# Patient Record
Sex: Female | Born: 1960 | Race: White | Hispanic: No | Marital: Married | Smoking: Never smoker
Health system: Southern US, Community
[De-identification: ages and names within clinical notes are randomized; demographics above are authoritative.]

## PROBLEM LIST (undated history)

## (undated) DIAGNOSIS — E079 Disorder of thyroid, unspecified: Secondary | ICD-10-CM

## (undated) DIAGNOSIS — L0390 Cellulitis, unspecified: Secondary | ICD-10-CM

## (undated) DIAGNOSIS — R011 Cardiac murmur, unspecified: Secondary | ICD-10-CM

## (undated) DIAGNOSIS — I10 Essential (primary) hypertension: Secondary | ICD-10-CM

## (undated) DIAGNOSIS — E119 Type 2 diabetes mellitus without complications: Secondary | ICD-10-CM

## (undated) DIAGNOSIS — Z22322 Carrier or suspected carrier of Methicillin resistant Staphylococcus aureus: Secondary | ICD-10-CM

## (undated) HISTORY — PX: HERNIA REPAIR: SHX51

## (undated) HISTORY — PX: CHOLECYSTECTOMY: SHX55

## (undated) HISTORY — DX: Disorder of thyroid, unspecified: E07.9

## (undated) HISTORY — PX: APPENDECTOMY: SHX54

---

## 2012-12-27 ENCOUNTER — Emergency Department (HOSPITAL_COMMUNITY)
Admission: EM | Admit: 2012-12-27 | Discharge: 2012-12-27 | Disposition: A | Discharge status: Home / Self Care | Payer: BC Managed Care – PPO | Attending: Emergency Medicine | Admitting: Emergency Medicine

## 2012-12-27 ENCOUNTER — Encounter (HOSPITAL_COMMUNITY): Payer: Self-pay | Admitting: *Deleted

## 2012-12-27 DIAGNOSIS — L299 Pruritus, unspecified: Secondary | ICD-10-CM | POA: Insufficient documentation

## 2012-12-27 DIAGNOSIS — R21 Rash and other nonspecific skin eruption: Secondary | ICD-10-CM | POA: Insufficient documentation

## 2012-12-27 DIAGNOSIS — R011 Cardiac murmur, unspecified: Secondary | ICD-10-CM | POA: Insufficient documentation

## 2012-12-27 DIAGNOSIS — E119 Type 2 diabetes mellitus without complications: Secondary | ICD-10-CM | POA: Insufficient documentation

## 2012-12-27 DIAGNOSIS — I10 Essential (primary) hypertension: Secondary | ICD-10-CM | POA: Insufficient documentation

## 2012-12-27 DIAGNOSIS — Z91040 Latex allergy status: Secondary | ICD-10-CM | POA: Insufficient documentation

## 2012-12-27 DIAGNOSIS — Z8614 Personal history of Methicillin resistant Staphylococcus aureus infection: Secondary | ICD-10-CM | POA: Insufficient documentation

## 2012-12-27 DIAGNOSIS — L02419 Cutaneous abscess of limb, unspecified: Secondary | ICD-10-CM | POA: Insufficient documentation

## 2012-12-27 DIAGNOSIS — L0390 Cellulitis, unspecified: Secondary | ICD-10-CM

## 2012-12-27 DIAGNOSIS — Z880 Allergy status to penicillin: Secondary | ICD-10-CM | POA: Insufficient documentation

## 2012-12-27 HISTORY — DX: Cardiac murmur, unspecified: R01.1

## 2012-12-27 HISTORY — DX: Essential (primary) hypertension: I10

## 2012-12-27 HISTORY — DX: Cellulitis, unspecified: L03.90

## 2012-12-27 LAB — URINALYSIS, ROUTINE W REFLEX MICROSCOPIC
Bilirubin Urine: NEGATIVE
Glucose, UA: NEGATIVE mg/dL
Hgb urine dipstick: NEGATIVE
Ketones, ur: NEGATIVE mg/dL
Leukocytes, UA: NEGATIVE
Nitrite: NEGATIVE
Protein, ur: NEGATIVE mg/dL
Specific Gravity, Urine: 1.02 (ref 1.005–1.030)
Urobilinogen, UA: 0.2 mg/dL (ref 0.0–1.0)
pH: 6.5 (ref 5.0–8.0)

## 2012-12-27 LAB — BASIC METABOLIC PANEL
BUN: 17 mg/dL (ref 6–23)
CO2: 28 mEq/L (ref 19–32)
Calcium: 9.4 mg/dL (ref 8.4–10.5)
Chloride: 101 mEq/L (ref 96–112)
Creatinine, Ser: 0.93 mg/dL (ref 0.50–1.10)
GFR calc Af Amer: 79 mL/min — ABNORMAL LOW (ref >90)
GFR calc non Af Amer: 68 mL/min — ABNORMAL LOW (ref >90)
Glucose, Bld: 123 mg/dL — ABNORMAL HIGH (ref 70–99)
Potassium: 3.6 mEq/L (ref 3.5–5.1)
Sodium: 137 mEq/L (ref 135–145)

## 2012-12-27 LAB — CBC WITH DIFFERENTIAL/PLATELET
Basophils Absolute: 0 10*3/uL (ref 0.0–0.1)
Basophils Relative: 0 % (ref 0–1)
Eosinophils Absolute: 0.2 10*3/uL (ref 0.0–0.7)
Eosinophils Relative: 2 % (ref 0–5)
HCT: 35.9 % — ABNORMAL LOW (ref 36.0–46.0)
Hemoglobin: 12.1 g/dL (ref 12.0–15.0)
Lymphocytes Relative: 30 % (ref 12–46)
Lymphs Abs: 2.3 10*3/uL (ref 0.7–4.0)
MCH: 29.1 pg (ref 26.0–34.0)
MCHC: 33.7 g/dL (ref 30.0–36.0)
MCV: 86.3 fL (ref 78.0–100.0)
Monocytes Absolute: 0.6 10*3/uL (ref 0.1–1.0)
Monocytes Relative: 7 % (ref 3–12)
Neutro Abs: 4.6 10*3/uL (ref 1.7–7.7)
Neutrophils Relative %: 60 % (ref 43–77)
Platelets: 261 10*3/uL (ref 150–400)
RBC: 4.16 MIL/uL (ref 3.87–5.11)
RDW: 14 % (ref 11.5–15.5)
WBC: 7.6 10*3/uL (ref 4.0–10.5)

## 2012-12-27 MED ORDER — ACETAMINOPHEN 325 MG PO TABS
650.0000 mg | ORAL_TABLET | Freq: Once | ORAL | Status: AC
  Administered 2012-12-27: 650 mg via ORAL
  Filled 2012-12-27: qty 2

## 2012-12-27 MED ORDER — CLINDAMYCIN HCL 150 MG PO CAPS
300.0000 mg | ORAL_CAPSULE | Freq: Once | ORAL | Status: AC
  Administered 2012-12-27: 300 mg via ORAL
  Filled 2012-12-27: qty 2

## 2012-12-27 MED ORDER — CLINDAMYCIN HCL 300 MG PO CAPS: 300.0000 mg | ORAL_CAPSULE | Freq: Four times a day (QID) | ORAL | Status: DC

## 2012-12-27 MED ORDER — SODIUM CHLORIDE 0.9 % IV BOLUS (SEPSIS)
1000.0000 mL | Freq: Once | INTRAVENOUS | Status: AC
  Administered 2012-12-27: 1000 mL via INTRAVENOUS

## 2012-12-27 MED ORDER — SODIUM CHLORIDE 0.9 % IV SOLN
INTRAVENOUS | Status: DC
  Administered 2012-12-27: 18:00:00 via INTRAVENOUS

## 2012-12-27 NOTE — ED Notes (Signed)
Swelling, redness rt lower leg, Hx of cellulitis.

## 2012-12-27 NOTE — ED Notes (Signed)
Requesting tylenol for leg pain.  meds given.

## 2012-12-27 NOTE — ED Provider Notes (Signed)
History     This chart was scribed for Flint Melter, MD, MD by Smitty Pluck, ED Scribe. The patient was seen in room APAH6/APAH6 and the patient's care was started at 3:52 PM.   CSN: [id number]  Arrival date & time 12/27/12  1351    Chief Complaint  Patient presents with  . Recurrent Skin Infections     The history is provided by the patient and medical records. No language interpreter was used.   Vanessa Vega is a 52 y.o. female with hx of DM, who presents to the Emergency Department complaining of constant, moderate cellulitis of lower left leg onset 2 days ago. Pt reports that she had similar symptoms in the past and MRSA 2 years ago at same location. Pt reports redness, itching and swelling at site of cellulitis on lower right leg. She states she was given abx treatment last time and it worked. She does not take medication for DM because she was able to manage it with diet in the past but she has not had her CGB checked in a long time. Pt denies fever, chills, nausea, vomiting, diarrhea, weakness, cough, SOB and any other pain.   Past Medical History  Diagnosis Date  . Cellulitis   . Hypertension   . Heart murmur     Past Surgical History  Procedure Laterality Date  . Hernia repair    . Appendectomy    . Cholecystectomy      History reviewed. No pertinent family history.  History  Substance Use Topics  . Smoking status: Never Smoker   . Smokeless tobacco: Not on file  . Alcohol Use: No    OB History   Grav Para Term Preterm Abortions TAB SAB Ect Mult Living                  Review of Systems  Constitutional: Negative for fever and chills.  Skin: Positive for rash.  All other systems reviewed and are negative.    Allergies  Other; Latex; and Penicillins  Home Medications   Current Outpatient Rx  Name  Route  Sig  Dispense  Refill  . acetaminophen (TYLENOL) 500 MG tablet   Oral   Take 500 mg by mouth every 6 (six) hours as needed for pain.          . diphenhydrAMINE (BENADRYL) 25 MG tablet   Oral   Take 25-50 mg by mouth daily as needed for itching or allergies.         . Naphazoline-Polyethyl Glycol (EYE DROPS) 0.012-0.2 % SOLN   Ophthalmic   Apply 1 drop to eye daily as needed (for itching/allergy/symptoms).         . clindamycin (CLEOCIN) 300 MG capsule   Oral   Take 1 capsule (300 mg total) by mouth 4 (four) times daily. X 7 days   28 capsule   0     BP 159/91  Pulse 103  Temp(Src) 97.6 F (36.4 C) (Oral)  Resp 22  Ht 5\' 3"  (1.6 m)  Wt 350 lb (158.759 kg)  BMI 62.02 kg/m2  SpO2 99%  Physical Exam  Nursing note and vitals reviewed. Constitutional: She is oriented to person, place, and time. She appears well-developed and well-nourished.  HENT:  Head: Normocephalic and atraumatic.  Eyes: Conjunctivae and EOM are normal. Pupils are equal, round, and reactive to light.  Neck: Normal range of motion and phonation normal. Neck supple.  Cardiovascular: Normal rate, regular rhythm and intact distal pulses.  Pulmonary/Chest: Effort normal and breath sounds normal. She exhibits no tenderness.  Abdominal: Soft. She exhibits no distension. There is no tenderness. There is no guarding.  Musculoskeletal: Normal range of motion.  Right foot is mildly swollen but non tender  Neurological: She is alert and oriented to person, place, and time. She has normal strength. She exhibits normal muscle tone.  Skin: Skin is warm and dry.  Red with induration of lateral and posterior half of right lower leg    Psychiatric: She has a normal mood and affect. Her behavior is normal. Judgment and thought content normal.    ED Course  Procedures (including critical care time) DIAGNOSTIC STUDIES: Oxygen Saturation is 99% on room air, normal by my interpretation.   Filed Vitals:   12/27/12 1410 12/27/12 1642 12/27/12 1821  BP: 159/91 155/96 131/87  Pulse: 103 95 83  Temp: 97.6 F (36.4 C)    TempSrc: Oral    Resp: 22 18 20    Height: 5\' 3"  (1.6 m)    Weight: 350 lb (158.759 kg)    SpO2: 99% 94% 98%    COORDINATION OF CARE: 3:55 PM Discussed ED treatment with pt and pt agrees.  Medications  sodium chloride 0.9 % bolus 1,000 mL (0 mLs Intravenous Stopped 12/27/12 1823)  acetaminophen (TYLENOL) tablet 650 mg (650 mg Oral Given 12/27/12 1824)  clindamycin (CLEOCIN) capsule 300 mg (300 mg Oral Given 12/27/12 2022)    8:27 PM Discussed lab results with pt. Pt instructed to take abx and soak leg. Pt is ready for discharge.   Labs Reviewed  CBC WITH DIFFERENTIAL - Abnormal; Notable for the following:    HCT 35.9 (*)    All other components within normal limits  BASIC METABOLIC PANEL - Abnormal; Notable for the following:    Glucose, Bld 123 (*)    GFR calc non Af Amer 68 (*)    GFR calc Af Amer 79 (*)    All other components within normal limits  URINALYSIS, ROUTINE W REFLEX MICROSCOPIC - Abnormal; Notable for the following:    APPearance HAZY (*)    All other components within normal limits  URINE CULTURE      1. Cellulitis       MDM  Right leg, cellulitis, recurrent. No generalized infection, doubt sepsis, metabolic, instability or osteomyelitis. Her blood sugars only minimally elevated. Doubt metabolic instability, serious bacterial infection or impending vascular collapse; the patient is stable for discharge.    Nursing Notes Reviewed/ Care Coordinated, and agree without changes. Applicable Imaging Reviewed.  Interpretation of Laboratory Data incorporated into ED treatment    Plan: Home Medications- clindamycin; Home Treatments- heat treatment; Recommended follow up- PCP of choice as soon as possible   I personally performed the services described in this documentation, which was scribed in my presence. The recorded information has been reviewed and is accurate.        Flint Melter, MD 12/28/12 Jacinta Shoe

## 2012-12-27 NOTE — ED Notes (Signed)
Pt unable to void at this time. 

## 2012-12-27 NOTE — ED Notes (Signed)
C/o swelling/redness and pain to right lower Extremity x 2 days with hx of cellulitis in same leg.  Area warm to touch, red.  Pt denies fever.  No drainage.  nad noted.

## 2012-12-29 ENCOUNTER — Emergency Department (HOSPITAL_COMMUNITY): Payer: BC Managed Care – PPO

## 2012-12-29 ENCOUNTER — Inpatient Hospital Stay (HOSPITAL_COMMUNITY)
Admission: EM | Admit: 2012-12-29 | Discharge: 2013-01-01 | DRG: 278 | Disposition: A | Discharge status: Home / Self Care | Payer: BC Managed Care – PPO | Attending: Internal Medicine | Admitting: Internal Medicine

## 2012-12-29 ENCOUNTER — Encounter (HOSPITAL_COMMUNITY): Payer: Self-pay

## 2012-12-29 DIAGNOSIS — E669 Obesity, unspecified: Secondary | ICD-10-CM

## 2012-12-29 DIAGNOSIS — L03119 Cellulitis of unspecified part of limb: Principal | ICD-10-CM | POA: Diagnosis present

## 2012-12-29 DIAGNOSIS — Z880 Allergy status to penicillin: Secondary | ICD-10-CM

## 2012-12-29 DIAGNOSIS — E119 Type 2 diabetes mellitus without complications: Secondary | ICD-10-CM | POA: Diagnosis present

## 2012-12-29 DIAGNOSIS — E039 Hypothyroidism, unspecified: Secondary | ICD-10-CM | POA: Diagnosis present

## 2012-12-29 DIAGNOSIS — L0390 Cellulitis, unspecified: Secondary | ICD-10-CM

## 2012-12-29 DIAGNOSIS — A4902 Methicillin resistant Staphylococcus aureus infection, unspecified site: Secondary | ICD-10-CM | POA: Diagnosis present

## 2012-12-29 DIAGNOSIS — L270 Generalized skin eruption due to drugs and medicaments taken internally: Secondary | ICD-10-CM | POA: Diagnosis not present

## 2012-12-29 DIAGNOSIS — Z79899 Other long term (current) drug therapy: Secondary | ICD-10-CM

## 2012-12-29 DIAGNOSIS — I10 Essential (primary) hypertension: Secondary | ICD-10-CM | POA: Diagnosis present

## 2012-12-29 DIAGNOSIS — Z6841 Body Mass Index (BMI) 40.0 and over, adult: Secondary | ICD-10-CM

## 2012-12-29 DIAGNOSIS — Z91040 Latex allergy status: Secondary | ICD-10-CM

## 2012-12-29 DIAGNOSIS — L02419 Cutaneous abscess of limb, unspecified: Principal | ICD-10-CM | POA: Diagnosis present

## 2012-12-29 DIAGNOSIS — T368X5A Adverse effect of other systemic antibiotics, initial encounter: Secondary | ICD-10-CM | POA: Diagnosis not present

## 2012-12-29 LAB — CBC WITH DIFFERENTIAL/PLATELET
Basophils Absolute: 0 10*3/uL (ref 0.0–0.1)
Eosinophils Relative: 3 % (ref 0–5)
HCT: 38.4 % (ref 36.0–46.0)
Lymphocytes Relative: 22 % (ref 12–46)
Lymphs Abs: 1.7 10*3/uL (ref 0.7–4.0)
MCV: 85.7 fL (ref 78.0–100.0)
Monocytes Absolute: 0.5 10*3/uL (ref 0.1–1.0)
Neutro Abs: 5.1 10*3/uL (ref 1.7–7.7)
Platelets: 285 10*3/uL (ref 150–400)
RBC: 4.48 MIL/uL (ref 3.87–5.11)
RDW: 14 % (ref 11.5–15.5)
WBC: 7.5 10*3/uL (ref 4.0–10.5)

## 2012-12-29 LAB — COMPREHENSIVE METABOLIC PANEL
ALT: 21 U/L (ref 0–35)
AST: 23 U/L (ref 0–37)
CO2: 27 mEq/L (ref 19–32)
Calcium: 10.2 mg/dL (ref 8.4–10.5)
Chloride: 100 mEq/L (ref 96–112)
GFR calc Af Amer: >90 mL/min (ref >90)
GFR calc non Af Amer: >90 mL/min (ref >90)
Glucose, Bld: 130 mg/dL — ABNORMAL HIGH (ref 70–99)
Sodium: 140 mEq/L (ref 135–145)
Total Bilirubin: 0.3 mg/dL (ref 0.3–1.2)

## 2012-12-29 LAB — URINE CULTURE

## 2012-12-29 MED ORDER — DIAZEPAM 5 MG PO TABS
5.0000 mg | ORAL_TABLET | Freq: Once | ORAL | Status: AC
  Administered 2012-12-29: 5 mg via ORAL
  Filled 2012-12-29: qty 1

## 2012-12-29 MED ORDER — VANCOMYCIN HCL IN DEXTROSE 1-5 GM/200ML-% IV SOLN
INTRAVENOUS | Status: AC
  Filled 2012-12-29: qty 400

## 2012-12-29 MED ORDER — OXYCODONE HCL 5 MG PO TABS
5.0000 mg | ORAL_TABLET | ORAL | Status: DC | PRN
  Administered 2012-12-29: 5 mg via ORAL
  Filled 2012-12-29: qty 1

## 2012-12-29 MED ORDER — NAPHAZOLINE HCL 0.1 % OP SOLN
1.0000 [drp] | Freq: Every day | OPHTHALMIC | Status: DC | PRN
  Filled 2012-12-29 (×2): qty 15

## 2012-12-29 MED ORDER — DIPHENHYDRAMINE HCL 25 MG PO TABS
25.0000 mg | ORAL_TABLET | Freq: Every day | ORAL | Status: DC | PRN
  Administered 2012-12-30: 25 mg via ORAL
  Filled 2012-12-29 (×2): qty 1

## 2012-12-29 MED ORDER — HEPARIN SODIUM (PORCINE) 5000 UNIT/ML IJ SOLN
5000.0000 [IU] | Freq: Three times a day (TID) | INTRAMUSCULAR | Status: DC
  Administered 2012-12-29 – 2013-01-01 (×8): 5000 [IU] via SUBCUTANEOUS
  Filled 2012-12-29 (×8): qty 1

## 2012-12-29 MED ORDER — VANCOMYCIN HCL IN DEXTROSE 1-5 GM/200ML-% IV SOLN
1000.0000 mg | Freq: Three times a day (TID) | INTRAVENOUS | Status: DC
  Administered 2012-12-29 – 2013-01-01 (×6): 1000 mg via INTRAVENOUS
  Filled 2012-12-29 (×15): qty 200

## 2012-12-29 MED ORDER — ACETAMINOPHEN 500 MG PO TABS
1000.0000 mg | ORAL_TABLET | Freq: Four times a day (QID) | ORAL | Status: DC | PRN
  Administered 2012-12-29 – 2012-12-31 (×3): 1000 mg via ORAL
  Filled 2012-12-29 (×3): qty 2

## 2012-12-29 MED ORDER — ONDANSETRON HCL 4 MG PO TABS: 4.0000 mg | ORAL_TABLET | Freq: Four times a day (QID) | ORAL | Status: DC | PRN

## 2012-12-29 MED ORDER — ONDANSETRON HCL 4 MG/2ML IJ SOLN: 4.0000 mg | Freq: Four times a day (QID) | INTRAMUSCULAR | Status: DC | PRN

## 2012-12-29 MED ORDER — VANCOMYCIN HCL IN DEXTROSE 1-5 GM/200ML-% IV SOLN
1000.0000 mg | Freq: Once | INTRAVENOUS | Status: AC
  Administered 2012-12-29: 1000 mg via INTRAVENOUS
  Filled 2012-12-29: qty 200

## 2012-12-29 NOTE — H&P (Signed)
[facility] History and Physical  Vanessa Vega ZOX:[id number] DOB: 1960/11/11 DOA: 12/29/2012  Referring physician: Dr. Estell Harpin, ER physician. PCP: No PCP Per Patient    Chief Complaint: Right lower leg inflammation.  HPI: Vanessa Vega is a 52 y.o. female who presents with right lower leg redness and swelling which started approximately 3-4 days ago. She was treated in emergency room with oral clindamycin but she has not improved. She has been feeling subjectively feverish. She cannot remember any contact or insect bite on the leg. She does tell me that 2 years ago she had cellulitis in the same leg and was told that this was MRSA.   Review of Systems:   Apart from history of present illness, other systems negative.  Past Medical History  Diagnosis Date  . Cellulitis   . Hypertension   . Heart murmur    Past Surgical History  Procedure Laterality Date  . Hernia repair    . Appendectomy    . Cholecystectomy     Social History:  She is married and lives with her husband. She does not smoke cigarettes, does not drink alcohol.  Allergies  Allergen Reactions  . Other Shortness Of Breath    Some pain medication given IV: loss of hearing, dizziness  . Latex Itching and Rash  . Penicillins Rash    No family history on file. no family history of diabetes.  Prior to Admission medications   Medication Sig Start Date End Date Taking? Authorizing Provider  acetaminophen (TYLENOL) 500 MG tablet Take 500 mg by mouth every 6 (six) hours as needed for pain.   Yes Historical Provider, MD  clindamycin (CLEOCIN) 300 MG capsule Take 1 capsule (300 mg total) by mouth 4 (four) times daily. X 7 days 12/27/12  Yes Flint Melter, MD  diphenhydrAMINE (BENADRYL) 25 MG tablet Take 25 mg by mouth daily as needed for itching or allergies.    Yes Historical Provider, MD  furosemide (LASIX) 40 MG tablet Take 40 mg by mouth daily.   Yes Historical Provider, MD  Naphazoline-Polyethyl Glycol (EYE DROPS)  0.012-0.2 % SOLN Apply 1 drop to eye daily as needed (for itching/allergy/symptoms).   Yes Historical Provider, MD   Physical Exam: Filed Vitals:   12/29/12 1311 12/29/12 1600  BP: 124/93 131/81  Pulse: 100 85  Temp: 97.9 F (36.6 C)   TempSrc: Oral   Resp: 22 18  Height: 5\' 3"  (1.6 m)   Weight: 149.233 kg (329 lb)   SpO2: 100% 97%     General:  She looks systemically well. She is obese. She is not toxic or septic.  Eyes: No pallor. No jaundice.  ENT: No abnormalities.  Neck: No lymphadenopathy. No obvious thyroid megaly.  Cardiovascular: Heart sounds are present and normal without gallop rhythm. There is a systolic murmur heard mostly in the aortic area, she tells me that she has had this for many years.  Respiratory: Lung fields are clear.  Abdomen: Soft, nontender. No hepatosplenomegaly.  Skin: Right lower leg cellulitis, extending from the knee down to the ankle.  Musculoskeletal: No acute joint abnormalities.  Psychiatric: Appropriate affect.  Neurologic: Alert and orientated without any focal neurological signs.  Labs on Admission:  Basic Metabolic Panel:  Recent Labs Lab 12/27/12 1636 12/29/12 1553  NA 137 140  K 3.6 3.7  CL 101 100  CO2 28 27  GLUCOSE 123* 130*  BUN 17 16  CREATININE 0.93 0.79  CALCIUM 9.4 10.2   Liver Function Tests:  Recent Labs Lab 12/29/12 1553  AST 23  ALT 21  ALKPHOS 90  BILITOT 0.3  PROT 8.2  ALBUMIN 4.1     CBC:  Recent Labs Lab 12/27/12 1636 12/29/12 1553  WBC 7.6 7.5  NEUTROABS 4.6 5.1  HGB 12.1 13.1  HCT 35.9* 38.4  MCV 86.3 85.7  PLT 261 285   Cardiac Enzymes:  Recent Labs Lab 12/29/12 1553  TROPONINI <0.30     Radiological Exams on Admission: Dg Chest Port 1 View  12/29/2012  *RADIOLOGY REPORT*  Clinical Data: Shortness of breath.  PORTABLE CHEST - 1 VIEW  Comparison: None.  Findings: The heart is mildly enlarged.  No overt edema or infiltrate is seen.  No pleural fluid identified.   IMPRESSION: Cardiomegaly without pulmonary edema.   Original Report Authenticated By: Irish Lack, M.D.       Assessment/Plan   1. Right lower leg cellulitis, probably MRSA. 2. Hypertension, previously diagnosed, not taking any medications. 3. Obesity. 4. Hyperglycemia.  Plan: 1. Admit to medical floor. 2. Intravenous vancomycin. 3. Check TSH, hemoglobin A1c. Further recommendations will depend on patient's hospital progress.   Code Status: Full code.  Family Communication: Discussed plan with patient at the bedside.   Disposition Plan: Home when medically stable.  Time spent: 45 minutes.  Wilson Singer [facility] Pager [phone].  If 7PM-7AM, please contact night-coverage [email or web address] Password [facility] 12/29/2012, 5:40 PM

## 2012-12-29 NOTE — Progress Notes (Signed)
ANTIBIOTIC CONSULT NOTE - INITIAL  Pharmacy Consult for Vancomycin Indication: cellulitis  Allergies  Allergen Reactions  . Other Shortness Of Breath    Some pain medication given IV: loss of hearing, dizziness  . Latex Itching and Rash  . Penicillins Rash    Patient Measurements: Height: 5\' 3"  (160 cm) Weight: 329 lb (149.233 kg) IBW/kg (Calculated) : 52.4  Vital Signs: Temp: 97.9 F (36.6 C) (05/07 1311) Temp src: Oral (05/07 1311) BP: 130/81 mmHg (05/07 1810) Pulse Rate: 83 (05/07 1810) Intake/Output from previous day:   Intake/Output from this shift:    Labs:  Recent Labs  12/27/12 1636 12/29/12 1553  WBC 7.6 7.5  HGB 12.1 13.1  PLT 261 285  CREATININE 0.93 0.79   Estimated Creatinine Clearance: 115.6 ml/min (by C-G formula based on Cr of 0.79). No results found for this basename: VANCOTROUGH, Leodis Binet, VANCORANDOM, GENTTROUGH, GENTPEAK, GENTRANDOM, TOBRATROUGH, TOBRAPEAK, TOBRARND, AMIKACINPEAK, AMIKACINTROU, AMIKACIN,  in the last 72 hours   Microbiology: Recent Results (from the past 720 hour(s))  URINE CULTURE     Status: None   Collection Time    12/27/12  6:35 PM      Result Value Range Status   Specimen Description URINE, CLEAN CATCH   Final   Special Requests NONE   Final   Culture  Setup Time 12/28/2012 04:07   Final   Colony Count >=100,000 COLONIES/ML   Final   Culture     Final   Value: Multiple bacterial morphotypes present, none predominant. Suggest appropriate recollection if clinically indicated.   Report Status 12/29/2012 FINAL   Final    Medical History: Past Medical History  Diagnosis Date  . Cellulitis   . Hypertension   . Heart murmur     Assessment: 52 yo morbidly obese female admitted with LE cellulitis.  Estimated Creatinine Clearance: 115.6 ml/min (by C-G formula based on Cr of 0.79).   Goal of Therapy:  Trough level 10-20  Plan:  Vancomycin 1gm IV Q8hrs Check trough at steady state Monitor labs, renal fxn, and  cultures  Valrie Hart A 12/29/2012,6:34 PM

## 2012-12-29 NOTE — ED Notes (Signed)
Pt states she was seen here on Monday and diagnosed with cellulitis to lower right leg and given PO abx for tx. Pt states symptoms have gotten worse and is here for re-evaluation.

## 2012-12-29 NOTE — ED Provider Notes (Signed)
History  This chart was scribed for Benny Lennert, MD by Bennett Scrape, ED Scribe. This patient was seen in room APA09/APA09 and the patient's care was started at 3:20 PM.  CSN: [id number]  Arrival date & time 12/29/12  1302   First MD Initiated Contact with Patient 12/29/12 1514      Chief Complaint  Patient presents with  . Recurrent Skin Infections     The history is provided by the patient. No language interpreter was used.    HPI Comments: Vanessa Vega is a 52 y.o. female who presents to the Emergency Department complaining of 4 days of gradual onset, gradually worsening, constant right lower leg redness and pain with associated new onset of redness around the left ankle and chills. She reports that she was seen 2 days ago in the ED and diagnosed with cellulitis. She has been taking the antibiotic clindamycin with worsening of the redness. She admits that she has been treated for cellulitis and MRSA on the right leg "several years ago". She states that during that time she had hyperglycemia but when the symptoms resolved, the hyperglycemia resolved. She denies having problems with hyperglycemia since in the past 2 years. She also expresses concern over one episode of "severe heartburn" yesterday with SOB and dizziness but denies any today. She has a h/o HTN but denies smoking and alcohol use.  Past Medical History  Diagnosis Date  . Cellulitis   . Hypertension   . Heart murmur     Past Surgical History  Procedure Laterality Date  . Hernia repair    . Appendectomy    . Cholecystectomy      No family history on file.  History  Substance Use Topics  . Smoking status: Never Smoker   . Smokeless tobacco: Not on file  . Alcohol Use: No    No OB history provided.  Review of Systems  Constitutional: Negative for appetite change and fatigue.  HENT: Negative for congestion, sinus pressure and ear discharge.   Eyes: Negative for discharge.  Respiratory: Positive for  shortness of breath (one episode, now resolved ). Negative for cough.   Cardiovascular: Positive for leg swelling. Negative for chest pain.  Gastrointestinal: Negative for abdominal pain and diarrhea.  Genitourinary: Negative for frequency and hematuria.  Musculoskeletal: Negative for back pain.  Skin: Positive for color change and rash.  Neurological: Positive for dizziness (one episode, now resolved ). Negative for seizures and headaches.  Psychiatric/Behavioral: Negative for hallucinations.    Allergies  Other; Latex; and Penicillins-rash per pt   Home Medications   Current Outpatient Rx  Name  Route  Sig  Dispense  Refill  . acetaminophen (TYLENOL) 500 MG tablet   Oral   Take 500 mg by mouth every 6 (six) hours as needed for pain.         . clindamycin (CLEOCIN) 300 MG capsule   Oral   Take 1 capsule (300 mg total) by mouth 4 (four) times daily. X 7 days   28 capsule   0   . diphenhydrAMINE (BENADRYL) 25 MG tablet   Oral   Take 25 mg by mouth daily as needed for itching or allergies.          . furosemide (LASIX) 40 MG tablet   Oral   Take 40 mg by mouth daily.         . Naphazoline-Polyethyl Glycol (EYE DROPS) 0.012-0.2 % SOLN   Ophthalmic   Apply 1 drop to eye daily  as needed (for itching/allergy/symptoms).           Triage Vitals: BP 124/93  Pulse 100  Temp(Src) 97.9 F (36.6 C) (Oral)  Resp 22  Ht 5\' 3"  (1.6 m)  Wt 329 lb (149.233 kg)  BMI 58.29 kg/m2  SpO2 100%  Physical Exam  Nursing note and vitals reviewed. Constitutional: She is oriented to person, place, and time. No distress.  Morbidly obese  HENT:  Head: Normocephalic and atraumatic.  Eyes: Conjunctivae and EOM are normal. No scleral icterus.  Neck: Neck supple. No thyromegaly present.  Cardiovascular: Normal rate and regular rhythm.  Exam reveals no gallop and no friction rub.   No murmur heard. Pulmonary/Chest: Effort normal and breath sounds normal. No stridor. She has no  wheezes. She has no rales. She exhibits no tenderness.  Abdominal: Soft. She exhibits no distension. There is no tenderness. There is no rebound.  Musculoskeletal: Normal range of motion. She exhibits edema (2+ edema).  Erythematous rash with tenderness to half of the right calf that appears to be cellulitis, erythematous rash 2 cm in diameter to left ankle that appears to be cellulitis  Lymphadenopathy:    She has no cervical adenopathy.  Neurological: She is alert and oriented to person, place, and time. Coordination normal.  Skin: Skin is warm and dry. No rash noted. No erythema.  Psychiatric: She has a normal mood and affect. Her behavior is normal.    ED Course  Procedures (including critical care time)  DIAGNOSTIC STUDIES: Oxygen Saturation is 100% on room air, normal by my interpretation.    COORDINATION OF CARE: 3:26 PM-Discussed treatment plan which includes admission for IV antibiotics with pt at bedside and pt agreed to plan.   Labs Reviewed  COMPREHENSIVE METABOLIC PANEL - Abnormal; Notable for the following:    Glucose, Bld 130 (*)    All other components within normal limits  CBC WITH DIFFERENTIAL  TROPONIN I   Dg Chest Port 1 View  12/29/2012  *RADIOLOGY REPORT*  Clinical Data: Shortness of breath.  PORTABLE CHEST - 1 VIEW  Comparison: None.  Findings: The heart is mildly enlarged.  No overt edema or infiltrate is seen.  No pleural fluid identified.  IMPRESSION: Cardiomegaly without pulmonary edema.   Original Report Authenticated By: Irish Lack, M.D.      No diagnosis found.    MDM        The chart was scribed for me under my direct supervision.  I personally performed the history, physical, and medical decision making and all procedures in the evaluation of this patient.Benny Lennert, MD 12/29/12 [phone]

## 2012-12-29 NOTE — ED Notes (Signed)
Pt reports came in Monday and was diagnosed with cellulitis of R leg.    Reports has been taking her antibiotics but redness is spreading.  Also noticed some redness around left ankle.    Both feet are warm to touch and pedal pulses are palpable.

## 2012-12-30 DIAGNOSIS — L0291 Cutaneous abscess, unspecified: Secondary | ICD-10-CM

## 2012-12-30 DIAGNOSIS — E039 Hypothyroidism, unspecified: Secondary | ICD-10-CM

## 2012-12-30 DIAGNOSIS — E119 Type 2 diabetes mellitus without complications: Secondary | ICD-10-CM

## 2012-12-30 LAB — CBC
MCV: 86.4 fL (ref 78.0–100.0)
Platelets: 264 10*3/uL (ref 150–400)
RBC: 4.05 MIL/uL (ref 3.87–5.11)
WBC: 5.9 10*3/uL (ref 4.0–10.5)

## 2012-12-30 LAB — LIPID PANEL
Cholesterol: 164 mg/dL (ref 0–200)
HDL: 48 mg/dL (ref >39)
Total CHOL/HDL Ratio: 3.4 RATIO

## 2012-12-30 LAB — TSH: TSH: 2.969 u[IU]/mL (ref 0.350–4.500)

## 2012-12-30 LAB — COMPREHENSIVE METABOLIC PANEL
Albumin: 3.4 g/dL — ABNORMAL LOW (ref 3.5–5.2)
BUN: 13 mg/dL (ref 6–23)
Chloride: 101 mEq/L (ref 96–112)
Creatinine, Ser: 0.83 mg/dL (ref 0.50–1.10)
GFR calc non Af Amer: 79 mL/min — ABNORMAL LOW (ref >90)
Total Bilirubin: 0.3 mg/dL (ref 0.3–1.2)

## 2012-12-30 LAB — HEMOGLOBIN A1C: Mean Plasma Glucose: 143 mg/dL — ABNORMAL HIGH (ref <117)

## 2012-12-30 MED ORDER — SODIUM CHLORIDE 0.9 % IJ SOLN
10.0000 mL | INTRAMUSCULAR | Status: DC | PRN
Start: 2012-12-30 — End: 2013-01-01
  Administered 2012-12-30: 10 mL via INTRAVENOUS

## 2012-12-30 MED ORDER — LEVOFLOXACIN IN D5W 500 MG/100ML IV SOLN
500.0000 mg | INTRAVENOUS | Status: DC
  Administered 2012-12-30: 500 mg via INTRAVENOUS
  Filled 2012-12-30 (×3): qty 100

## 2012-12-30 MED ORDER — LEVOTHYROXINE SODIUM 25 MCG PO TABS
25.0000 ug | ORAL_TABLET | Freq: Every day | ORAL | Status: DC
  Administered 2012-12-30 – 2013-01-01 (×3): 25 ug via ORAL
  Filled 2012-12-30 (×3): qty 1

## 2012-12-30 MED ORDER — DIPHENHYDRAMINE HCL 25 MG PO CAPS
25.0000 mg | ORAL_CAPSULE | Freq: Three times a day (TID) | ORAL | Status: DC | PRN
  Administered 2012-12-31 – 2013-01-01 (×4): 25 mg via ORAL
  Filled 2012-12-30 (×4): qty 1

## 2012-12-30 MED ORDER — SODIUM CHLORIDE 0.9 % IV SOLN
Freq: Once | INTRAVENOUS | Status: AC
  Administered 2012-12-30: 14:00:00 via INTRAVENOUS

## 2012-12-30 NOTE — Progress Notes (Signed)
Subjective: This lady was admitted with right lower leg cellulitis. She feels that her leg is improving. Also, she does describe hair loss in the last couple of years, prefers cold weather and states fatigue. Her TSHs close to 3.           Physical Exam: Blood pressure 134/84, pulse 80, temperature 98.8 F (37.1 C), temperature source Oral, resp. rate 16, height 5\' 3"  (1.6 m), weight 157 kg (346 lb 2 oz), SpO2 98.00%. She looks systemically well. She is not toxic or septic. Indeed, the right lower leg cellulitis does look like it's improving, not significantly yet. No new other physical findings.   Investigations:  Recent Results (from the past 240 hour(s))  URINE CULTURE     Status: None   Collection Time    12/27/12  6:35 PM      Result Value Range Status   Specimen Description URINE, CLEAN CATCH   Final   Special Requests NONE   Final   Culture  Setup Time 12/28/2012 04:07   Final   Colony Count >=100,000 COLONIES/ML   Final   Culture     Final   Value: Multiple bacterial morphotypes present, none predominant. Suggest appropriate recollection if clinically indicated.   Report Status 12/29/2012 FINAL   Final  MRSA PCR SCREENING     Status: None   Collection Time    12/29/12  7:12 PM      Result Value Range Status   MRSA by PCR NEGATIVE  NEGATIVE Final   Comment:            The GeneXpert MRSA Assay (FDA     approved for NASAL specimens     only), is one component of a     comprehensive MRSA colonization     surveillance program. It is not     intended to diagnose MRSA     infection nor to guide or     monitor treatment for     MRSA infections.     Basic Metabolic Panel:  Recent Labs  16/10/96 1553 12/30/12 0447  NA 140 138  K 3.7 3.7  CL 100 101  CO2 27 28  GLUCOSE 130* 113*  BUN 16 13  CREATININE 0.79 0.83  CALCIUM 10.2 9.4   Liver Function Tests:  Recent Labs  12/29/12 1553 12/30/12 0447  AST 23 18  ALT 21 16  ALKPHOS 90 75  BILITOT  0.3 0.3  PROT 8.2 6.8  ALBUMIN 4.1 3.4*     CBC:  Recent Labs  12/27/12 1636 12/29/12 1553 12/30/12 0447  WBC 7.6 7.5 5.9  NEUTROABS 4.6 5.1  --   HGB 12.1 13.1 11.8*  HCT 35.9* 38.4 35.0*  MCV 86.3 85.7 86.4  PLT 261 285 264    Dg Chest Port 1 View  12/29/2012  *RADIOLOGY REPORT*  Clinical Data: Shortness of breath.  PORTABLE CHEST - 1 VIEW  Comparison: None.  Findings: The heart is mildly enlarged.  No overt edema or infiltrate is seen.  No pleural fluid identified.  IMPRESSION: Cardiomegaly without pulmonary edema.   Original Report Authenticated By: Irish Lack, M.D.       Medications: I have reviewed the patient's current medications.  Impression: 1. Right lower leg cellulitis. Slowly improving. 2. Hemoglobin A1c consistent with new diagnosis of diabetes. 3. Clinical hypothyroidism, TSH almost 3. 4. Hypertension. 5. Obesity.     Plan: 1. Add intravenous Levaquin to cover for Pseudomonas. Continue with intravenous vancomycin.  2. Start Synthroid. 3. We'll discuss nutrition regarding her diabetes.     LOS: 1 day   Wilson Singer Pager [phone]  12/30/2012, 8:11 AM

## 2012-12-30 NOTE — Progress Notes (Signed)
ANTIBIOTIC CONSULT NOTE - FOLLOW UP  Pharmacy Consult for Vancomycin Indication: cellulitis  Allergies  Allergen Reactions  . Other Shortness Of Breath    Some pain medication given IV: loss of hearing, dizziness  . Latex Itching and Rash  . Penicillins Rash   Patient Measurements: Height: 5\' 3"  (160 cm) Weight: 346 lb 2 oz (157 kg) IBW/kg (Calculated) : 52.4  Vital Signs: Temp: 98.8 F (37.1 C) (05/08 0751) Temp src: Oral (05/08 0751) BP: 134/84 mmHg (05/08 0751) Pulse Rate: 80 (05/08 0751) Intake/Output from previous day: 05/07 0701 - 05/08 0700 In: 640 [P.O.:240; IV Piggyback:400] Out: -  Intake/Output from this shift: Total I/O In: 100 [IV Piggyback:100] Out: -   Labs:  Recent Labs  12/27/12 1636 12/29/12 1553 12/30/12 0447  WBC 7.6 7.5 5.9  HGB 12.1 13.1 11.8*  PLT 261 285 264  CREATININE 0.93 0.79 0.83   Estimated Creatinine Clearance: 115.2 ml/min (by C-G formula based on Cr of 0.83). No results found for this basename: VANCOTROUGH, Leodis Binet, VANCORANDOM, GENTTROUGH, GENTPEAK, GENTRANDOM, TOBRATROUGH, TOBRAPEAK, TOBRARND, AMIKACINPEAK, AMIKACINTROU, AMIKACIN,  in the last 72 hours   Microbiology: Recent Results (from the past 720 hour(s))  URINE CULTURE     Status: None   Collection Time    12/27/12  6:35 PM      Result Value Range Status   Specimen Description URINE, CLEAN CATCH   Final   Special Requests NONE   Final   Culture  Setup Time 12/28/2012 04:07   Final   Colony Count >=100,000 COLONIES/ML   Final   Culture     Final   Value: Multiple bacterial morphotypes present, none predominant. Suggest appropriate recollection if clinically indicated.   Report Status 12/29/2012 FINAL   Final  MRSA PCR SCREENING     Status: None   Collection Time    12/29/12  7:12 PM      Result Value Range Status   MRSA by PCR NEGATIVE  NEGATIVE Final   Comment:            The GeneXpert MRSA Assay (FDA     approved for NASAL specimens     only), is one  component of a     comprehensive MRSA colonization     surveillance program. It is not     intended to diagnose MRSA     infection nor to guide or     monitor treatment for     MRSA infections.    Anti-infectives   Start     Dose/Rate Route Frequency Ordered Stop   12/30/12 1000  levofloxacin (LEVAQUIN) IVPB 500 mg     500 mg 100 mL/hr over 60 Minutes Intravenous Every 24 hours 12/30/12 0810     12/29/12 2200  vancomycin (VANCOCIN) IVPB 1000 mg/200 mL premix     1,000 mg 200 mL/hr over 60 Minutes Intravenous Every 8 hours 12/29/12 1832     12/29/12 1530  vancomycin (VANCOCIN) IVPB 1000 mg/200 mL premix     1,000 mg 200 mL/hr over 60 Minutes Intravenous  Once 12/29/12 1528 12/29/12 1702     Assessment: 52yo morbidly obese female admitted with LE cellulitis.  Estimated Creatinine Clearance: 115.2 ml/min (by C-G formula based on Cr of 0.83).  Goal of Therapy:  Vancomycin trough level 10-15 mcg/ml  Plan:  Vancomycin 1gm IV q8hrs (started last pm) Check trough tomorrow am Monitor labs, renal fxn, and cultures Duration of therapy per MD  Valrie Hart A 12/30/2012,11:25 AM

## 2012-12-30 NOTE — Progress Notes (Signed)
Called into patients room, patient was scratching stating that she had a rash that just started. Patient also stated that she often gets a rash with any antibiotic she takes. Levaquin IV new order today. No known previous history of reaction to Levaquin but patient unsure if she has ever taken it before. Benadryl given as ordered and Dr. Karilyn Cota notified of possible reaction and asked for an increase in PRN frequency of Benadryl. Will continue to monitor.

## 2012-12-30 NOTE — Progress Notes (Signed)
Report called to C.Alston,RN. Patient to be transferred via wheelchair to room 325.

## 2012-12-30 NOTE — Progress Notes (Signed)
UR Chart Review Completed  

## 2012-12-30 NOTE — Care Management Note (Unsigned)
    Page 1 of 1   12/30/2012     3:33:54 PM   CARE MANAGEMENT NOTE 12/30/2012  Patient:  Vanessa Vega, Vanessa Vega   Account Number:  [id number]  Date Initiated:  12/30/2012  Documentation initiated by:  Anibal Henderson  Subjective/Objective Assessment:   patient admitted with cellulitis of the lower leg. Patient is from home, lives with spouse and will return home at discharge.     Action/Plan:   no needs identified   Anticipated DC Date:  01/01/2013   Anticipated DC Plan:  HOME/SELF CARE      DC Planning Services  CM consult      Choice offered to / List presented to:             Status of service:  In process, will continue to follow Medicare Important Message given?   (If response is "NO", the following Medicare IM given date fields will be blank) Date Medicare IM given:   Date Additional Medicare IM given:    Discharge Disposition:    Per UR Regulation:  Reviewed for med. necessity/level of care/duration of stay  If discussed at Long Length of Stay Meetings, dates discussed:    Comments:  12/30/12 1400 Cambria Osten's RN/CM

## 2012-12-31 LAB — BASIC METABOLIC PANEL
BUN: 13 mg/dL (ref 6–23)
Calcium: 9.5 mg/dL (ref 8.4–10.5)
Creatinine, Ser: 0.85 mg/dL (ref 0.50–1.10)
GFR calc non Af Amer: 76 mL/min — ABNORMAL LOW (ref >90)
Glucose, Bld: 136 mg/dL — ABNORMAL HIGH (ref 70–99)

## 2012-12-31 LAB — GLUCOSE, CAPILLARY

## 2012-12-31 NOTE — Progress Notes (Signed)
ANTIBIOTIC CONSULT NOTE - FOLLOW UP  Pharmacy Consult for Vancomycin Indication: cellulitis  Allergies  Allergen Reactions  . Other Shortness Of Breath    Some pain medication given IV: loss of hearing, dizziness  . Latex Itching and Rash  . Penicillins Rash   Patient Measurements: Height: 5\' 3"  (160 cm) Weight: 345 lb 9.6 oz (156.763 kg) IBW/kg (Calculated) : 52.4  Vital Signs: Temp: 97.9 F (36.6 C) (05/09 0459) Temp src: Oral (05/09 0459) BP: 129/83 mmHg (05/09 0459) Pulse Rate: 83 (05/09 0459) Intake/Output from previous day: 05/08 0701 - 05/09 0700 In: 300 [IV Piggyback:300] Out: -  Intake/Output from this shift:    Labs:  Recent Labs  12/29/12 1553 12/30/12 0447 12/31/12 0542  WBC 7.5 5.9  --   HGB 13.1 11.8*  --   PLT 285 264  --   CREATININE 0.79 0.83 0.85   Estimated Creatinine Clearance: 112.5 ml/min (by C-G formula based on Cr of 0.85).  Recent Labs  12/31/12 0542  VANCOTROUGH 11.3     Microbiology: Recent Results (from the past 720 hour(s))  URINE CULTURE     Status: None   Collection Time    12/27/12  6:35 PM      Result Value Range Status   Specimen Description URINE, CLEAN CATCH   Final   Special Requests NONE   Final   Culture  Setup Time 12/28/2012 04:07   Final   Colony Count >=100,000 COLONIES/ML   Final   Culture     Final   Value: Multiple bacterial morphotypes present, none predominant. Suggest appropriate recollection if clinically indicated.   Report Status 12/29/2012 FINAL   Final  MRSA PCR SCREENING     Status: None   Collection Time    12/29/12  7:12 PM      Result Value Range Status   MRSA by PCR NEGATIVE  NEGATIVE Final   Comment:            The GeneXpert MRSA Assay (FDA     approved for NASAL specimens     only), is one component of a     comprehensive MRSA colonization     surveillance program. It is not     intended to diagnose MRSA     infection nor to guide or     monitor treatment for     MRSA infections.     Anti-infectives   Start     Dose/Rate Route Frequency Ordered Stop   12/30/12 1000  levofloxacin (LEVAQUIN) IVPB 500 mg     500 mg 100 mL/hr over 60 Minutes Intravenous Every 24 hours 12/30/12 0810     12/29/12 2200  vancomycin (VANCOCIN) IVPB 1000 mg/200 mL premix     1,000 mg 200 mL/hr over 60 Minutes Intravenous Every 8 hours 12/29/12 1832     12/29/12 1530  vancomycin (VANCOCIN) IVPB 1000 mg/200 mL premix     1,000 mg 200 mL/hr over 60 Minutes Intravenous  Once 12/29/12 1528 12/29/12 1702     Assessment: 52yo morbidly obese female admitted with LE cellulitis.  Estimated Creatinine Clearance: 112.5 ml/min (by C-G formula based on Cr of 0.85).  Trough level is on target.  Goal of Therapy:  Vancomycin trough level 10-15 mcg/ml  Plan:  Continue Vancomycin 1gm IV q8hrs Check trough weekly while on Vancomycin Check SCr at least twice weekly while on Vancomycin  Monitor labs, renal fxn, and cultures Duration of therapy per MD  Valrie Hart A 12/31/2012,7:57 AM

## 2012-12-31 NOTE — Progress Notes (Signed)
Subjective: This lady was admitted with right lower leg cellulitis.  Today she describes a macular papular rash all over her body. Levaquin was started yesterday. I'm sure this is a allergy to Levaquin. Otherwise the right lower leg cellulitis appears to be improving somewhat.           Physical Exam: Blood pressure 129/83, pulse 83, temperature 97.9 F (36.6 C), temperature source Oral, resp. rate 20, height 5\' 3"  (1.6 m), weight 156.763 kg (345 lb 9.6 oz), SpO2 99.00%. She looks systemically well. She is not toxic or septic. Indeed, the right lower leg cellulitis does look like it's improving, not significantly yet. No new other physical findings. Maculopapular rash all over her body, consistent with Levaquin allergy.   Investigations:  Recent Results (from the past 240 hour(s))  URINE CULTURE     Status: None   Collection Time    12/27/12  6:35 PM      Result Value Range Status   Specimen Description URINE, CLEAN CATCH   Final   Special Requests NONE   Final   Culture  Setup Time 12/28/2012 04:07   Final   Colony Count >=100,000 COLONIES/ML   Final   Culture     Final   Value: Multiple bacterial morphotypes present, none predominant. Suggest appropriate recollection if clinically indicated.   Report Status 12/29/2012 FINAL   Final  MRSA PCR SCREENING     Status: None   Collection Time    12/29/12  7:12 PM      Result Value Range Status   MRSA by PCR NEGATIVE  NEGATIVE Final   Comment:            The GeneXpert MRSA Assay (FDA     approved for NASAL specimens     only), is one component of a     comprehensive MRSA colonization     surveillance program. It is not     intended to diagnose MRSA     infection nor to guide or     monitor treatment for     MRSA infections.     Basic Metabolic Panel:  Recent Labs  04/54/09 0447 12/31/12 0542  NA 138 140  K 3.7 3.9  CL 101 104  CO2 28 26  GLUCOSE 113* 136*  BUN 13 13  CREATININE 0.83 0.85  CALCIUM 9.4  9.5   Liver Function Tests:  Recent Labs  12/29/12 1553 12/30/12 0447  AST 23 18  ALT 21 16  ALKPHOS 90 75  BILITOT 0.3 0.3  PROT 8.2 6.8  ALBUMIN 4.1 3.4*     CBC:  Recent Labs  12/29/12 1553 12/30/12 0447  WBC 7.5 5.9  NEUTROABS 5.1  --   HGB 13.1 11.8*  HCT 38.4 35.0*  MCV 85.7 86.4  PLT 285 264    Dg Chest Port 1 View  12/29/2012  *RADIOLOGY REPORT*  Clinical Data: Shortness of breath.  PORTABLE CHEST - 1 VIEW  Comparison: None.  Findings: The heart is mildly enlarged.  No overt edema or infiltrate is seen.  No pleural fluid identified.  IMPRESSION: Cardiomegaly without pulmonary edema.   Original Report Authenticated By: Irish Lack, M.D.       Medications: I have reviewed the patient's current medications.  Impression: 1. Right lower leg cellulitis. Slowly improving. 2. Hemoglobin A1c consistent with new diagnosis of diabetes. 3. Clinical hypothyroidism, TSH almost 3. 4. Hypertension. 5. Obesity. 6. Levaquin allergic rash.     Plan: 1.  Discontinue Levaquin. 2. Continue with IV vancomycin for today.    LOS: 2 days   Wilson Singer Pager [phone]  12/31/2012, 10:56 AM

## 2013-01-01 LAB — GLUCOSE, CAPILLARY: Glucose-Capillary: 166 mg/dL — ABNORMAL HIGH (ref 70–99)

## 2013-01-01 MED ORDER — LEVOTHYROXINE SODIUM 25 MCG PO TABS: 25.0000 ug | ORAL_TABLET | Freq: Every day | ORAL | Status: DC

## 2013-01-01 MED ORDER — SULFAMETHOXAZOLE-TRIMETHOPRIM 800-160 MG PO TABS: 1.0000 | ORAL_TABLET | Freq: Two times a day (BID) | ORAL | Status: DC

## 2013-01-01 MED ORDER — OXYCODONE HCL 5 MG PO TABS: 5.0000 mg | ORAL_TABLET | ORAL | Status: DC | PRN

## 2013-01-01 NOTE — Discharge Summary (Signed)
Physician Discharge Summary  Vanessa Vega ZOX:[id number] DOB: March 14, 1961 DOA: 12/29/2012  PCP: No PCP Per Patient  Admit date: 12/29/2012 Discharge date: 01/01/2013  Time spent: Greater than 30 minutes  Recommendations for Outpatient Follow-up:  1. Followup with primary care physician, she will need to secure 1.  Discharge Diagnoses:  1. Cellulitis of the right lower leg, improving. Likely MRSA. 2. Hypothyroidism, newly diagnosed. 3. Type 2 diabetes mellitus, newly diagnosed. 4. Obesity.   Discharge Condition: Stable and improving.  Diet recommendation: Carbohydrate modified diet.  Filed Weights   12/30/12 0500 12/31/12 0459 01/01/13 0621  Weight: 157 kg (346 lb 2 oz) 156.763 kg (345 lb 9.6 oz) 156.582 kg (345 lb 3.2 oz)    History of present illness:  This very pleasant 52 year old lady presented to the hospital with symptoms of right lower leg inflammation and redness. Please see initial history as outlined below: HPI: Vanessa Vega is a 52 y.o. female who presents with right lower leg redness and swelling which started approximately 3-4 days ago. She was treated in emergency room with oral clindamycin but she has not improved. She has been feeling subjectively feverish. She cannot remember any contact or insect bite on the leg. She does tell me that 2 years ago she had cellulitis in the same leg and was told that this was MRSA.  Hospital Course:  The patient was admitted and started on intravenous vancomycin. In view of her newly diagnosed diabetes, Levaquin was added but she had a reaction to Levaquin and this gave her rash. Therefore Levaquin was discontinued. However, with the intravenous vancomycin his cellulitis is improving. The likely organism is MRSA .She has not been febrile during hospitalization. She was found to have elevated glucose and hemoglobin A1c was also elevated at 6.6%. Also her TSH was elevated and she has clinical hypothyroidism. She was started on Synthroid. She  will require followup for these chronic conditions. She will be discharged home on Bactrim for a ten-day course.  Procedures:  None.   Consultations:  None.  Discharge Exam: Filed Vitals:   12/31/12 0459 12/31/12 1351 12/31/12 2147 01/01/13 0621  BP: 129/83 127/92 129/79 125/78  Pulse: 83 96 84 75  Temp: 97.9 F (36.6 C) 98.7 F (37.1 C) 98 F (36.7 C) 98.2 F (36.8 C)  TempSrc: Oral Oral Oral Oral  Resp: 20 20 20 20   Height:      Weight: 156.763 kg (345 lb 9.6 oz)   156.582 kg (345 lb 3.2 oz)  SpO2: 99% 95% 96% 97%    General: She looks systemically well. Not toxic or septic. Cardiovascular: Heart sounds are present and normal without murmurs or added sounds. Respiratory: Lung fields are clear. She is alert and orientated. Her right leg does show improving cellulitis in the right lower leg. She continues to have a maculopapular rash all over body, less so, related to Levaquin  Discharge Instructions  Discharge Orders   Future Orders Complete By Expires     Diet - low sodium heart healthy  As directed     Increase activity slowly  As directed         Medication List    STOP taking these medications       clindamycin 300 MG capsule  Commonly known as:  CLEOCIN     furosemide 40 MG tablet  Commonly known as:  LASIX      TAKE these medications       acetaminophen 500 MG tablet  Commonly known as:  TYLENOL  Take 500 mg by mouth every 6 (six) hours as needed for pain.     diphenhydrAMINE 25 MG tablet  Commonly known as:  BENADRYL  Take 25 mg by mouth daily as needed for itching or allergies.     EYE DROPS 0.012-0.2 % Soln  Generic drug:  Naphazoline-Polyethyl Glycol  Apply 1 drop to eye daily as needed (for itching/allergy/symptoms).     levothyroxine 25 MCG tablet  Commonly known as:  SYNTHROID, LEVOTHROID  Take 1 tablet (25 mcg total) by mouth daily before breakfast.     oxyCODONE 5 MG immediate release tablet  Commonly known as:  Oxy IR/ROXICODONE   Take 1 tablet (5 mg total) by mouth every 4 (four) hours as needed.     sulfamethoxazole-trimethoprim 800-160 MG per tablet  Commonly known as:  BACTRIM DS  Take 1 tablet by mouth 2 (two) times daily.       Allergies  Allergen Reactions  . Other Shortness Of Breath    Some pain medication given IV: loss of hearing, dizziness  . Latex Itching and Rash  . Levaquin (Levofloxacin) Rash  . Penicillins Rash      The results of significant diagnostics from this hospitalization (including imaging, microbiology, ancillary and laboratory) are listed below for reference.    Significant Diagnostic Studies: Dg Chest Port 1 View  12/29/2012  *RADIOLOGY REPORT*  Clinical Data: Shortness of breath.  PORTABLE CHEST - 1 VIEW  Comparison: None.  Findings: The heart is mildly enlarged.  No overt edema or infiltrate is seen.  No pleural fluid identified.  IMPRESSION: Cardiomegaly without pulmonary edema.   Original Report Authenticated By: Irish Lack, M.D.     Microbiology: Recent Results (from the past 240 hour(s))  URINE CULTURE     Status: None   Collection Time    12/27/12  6:35 PM      Result Value Range Status   Specimen Description URINE, CLEAN CATCH   Final   Special Requests NONE   Final   Culture  Setup Time 12/28/2012 04:07   Final   Colony Count >=100,000 COLONIES/ML   Final   Culture     Final   Value: Multiple bacterial morphotypes present, none predominant. Suggest appropriate recollection if clinically indicated.   Report Status 12/29/2012 FINAL   Final  MRSA PCR SCREENING     Status: None   Collection Time    12/29/12  7:12 PM      Result Value Range Status   MRSA by PCR NEGATIVE  NEGATIVE Final   Comment:            The GeneXpert MRSA Assay (FDA     approved for NASAL specimens     only), is one component of a     comprehensive MRSA colonization     surveillance program. It is not     intended to diagnose MRSA     infection nor to guide or     monitor treatment  for     MRSA infections.     Labs: Basic Metabolic Panel:  Recent Labs Lab 12/27/12 1636 12/29/12 1553 12/30/12 0447 12/31/12 0542  NA 137 140 138 140  K 3.6 3.7 3.7 3.9  CL 101 100 101 104  CO2 28 27 28 26   GLUCOSE 123* 130* 113* 136*  BUN 17 16 13 13   CREATININE 0.93 0.79 0.83 0.85  CALCIUM 9.4 10.2 9.4 9.5   Liver Function Tests:  Recent Labs Lab 12/29/12 1553  12/30/12 0447  AST 23 18  ALT 21 16  ALKPHOS 90 75  BILITOT 0.3 0.3  PROT 8.2 6.8  ALBUMIN 4.1 3.4*     CBC:  Recent Labs Lab 12/27/12 1636 12/29/12 1553 12/30/12 0447  WBC 7.6 7.5 5.9  NEUTROABS 4.6 5.1  --   HGB 12.1 13.1 11.8*  HCT 35.9* 38.4 35.0*  MCV 86.3 85.7 86.4  PLT 261 285 264   Cardiac Enzymes:  Recent Labs Lab 12/29/12 1553  TROPONINI <0.30   CBG:  Recent Labs Lab 12/31/12 0715 12/31/12 1138 12/31/12 1619 12/31/12 2114 01/01/13 0713  GLUCAP 108* 133* 134* 124* 166*       Signed:  GOSRANI,NIMISH C  [facility] 01/01/2013, 10:37 AM

## 2013-01-01 NOTE — Progress Notes (Addendum)
Pt and her husband verbalize understanding of d/c instructions & medications. I instructed pt in the importance of a PCP. She states that Dr. Karilyn Cota told her to contact him in a couple of weeks to update him on her progress. IV d/c. No questions at this time. Pt d/c via wheelchair, accompanied by husband. Sheryn Bison

## 2014-01-10 ENCOUNTER — Emergency Department (HOSPITAL_COMMUNITY): Payer: BC Managed Care – PPO

## 2014-01-10 ENCOUNTER — Encounter (HOSPITAL_COMMUNITY): Payer: Self-pay | Admitting: Emergency Medicine

## 2014-01-10 ENCOUNTER — Emergency Department (HOSPITAL_COMMUNITY)
Admission: EM | Admit: 2014-01-10 | Discharge: 2014-01-10 | Disposition: A | Discharge status: Home / Self Care | Payer: BC Managed Care – PPO | Attending: Emergency Medicine | Admitting: Emergency Medicine

## 2014-01-10 ENCOUNTER — Emergency Department (HOSPITAL_COMMUNITY): Payer: Self-pay

## 2014-01-10 DIAGNOSIS — Z872 Personal history of diseases of the skin and subcutaneous tissue: Secondary | ICD-10-CM | POA: Insufficient documentation

## 2014-01-10 DIAGNOSIS — R51 Headache: Secondary | ICD-10-CM | POA: Insufficient documentation

## 2014-01-10 DIAGNOSIS — H538 Other visual disturbances: Secondary | ICD-10-CM | POA: Insufficient documentation

## 2014-01-10 DIAGNOSIS — R011 Cardiac murmur, unspecified: Secondary | ICD-10-CM | POA: Insufficient documentation

## 2014-01-10 DIAGNOSIS — Z91040 Latex allergy status: Secondary | ICD-10-CM | POA: Insufficient documentation

## 2014-01-10 DIAGNOSIS — M542 Cervicalgia: Secondary | ICD-10-CM | POA: Insufficient documentation

## 2014-01-10 DIAGNOSIS — R110 Nausea: Secondary | ICD-10-CM | POA: Insufficient documentation

## 2014-01-10 DIAGNOSIS — R079 Chest pain, unspecified: Secondary | ICD-10-CM | POA: Insufficient documentation

## 2014-01-10 DIAGNOSIS — Z792 Long term (current) use of antibiotics: Secondary | ICD-10-CM | POA: Insufficient documentation

## 2014-01-10 DIAGNOSIS — R0602 Shortness of breath: Secondary | ICD-10-CM | POA: Insufficient documentation

## 2014-01-10 DIAGNOSIS — R42 Dizziness and giddiness: Secondary | ICD-10-CM | POA: Insufficient documentation

## 2014-01-10 DIAGNOSIS — Z8614 Personal history of Methicillin resistant Staphylococcus aureus infection: Secondary | ICD-10-CM | POA: Insufficient documentation

## 2014-01-10 DIAGNOSIS — Z79899 Other long term (current) drug therapy: Secondary | ICD-10-CM | POA: Insufficient documentation

## 2014-01-10 DIAGNOSIS — Z880 Allergy status to penicillin: Secondary | ICD-10-CM | POA: Insufficient documentation

## 2014-01-10 DIAGNOSIS — I10 Essential (primary) hypertension: Secondary | ICD-10-CM | POA: Insufficient documentation

## 2014-01-10 DIAGNOSIS — R209 Unspecified disturbances of skin sensation: Secondary | ICD-10-CM | POA: Insufficient documentation

## 2014-01-10 HISTORY — DX: Carrier or suspected carrier of methicillin resistant Staphylococcus aureus: Z22.322

## 2014-01-10 LAB — CBC WITH DIFFERENTIAL/PLATELET
Basophils Absolute: 0 10*3/uL (ref 0.0–0.1)
Basophils Relative: 0 % (ref 0–1)
EOS ABS: 0.1 10*3/uL (ref 0.0–0.7)
Eosinophils Relative: 1 % (ref 0–5)
HEMATOCRIT: 37 % (ref 36.0–46.0)
HEMOGLOBIN: 12.2 g/dL (ref 12.0–15.0)
LYMPHS ABS: 2.4 10*3/uL (ref 0.7–4.0)
Lymphocytes Relative: 33 % (ref 12–46)
MCH: 28.5 pg (ref 26.0–34.0)
MCHC: 33 g/dL (ref 30.0–36.0)
MCV: 86.4 fL (ref 78.0–100.0)
MONO ABS: 0.6 10*3/uL (ref 0.1–1.0)
MONOS PCT: 9 % (ref 3–12)
NEUTROS PCT: 57 % (ref 43–77)
Neutro Abs: 4.1 10*3/uL (ref 1.7–7.7)
Platelets: 265 10*3/uL (ref 150–400)
RBC: 4.28 MIL/uL (ref 3.87–5.11)
RDW: 13.8 % (ref 11.5–15.5)
WBC: 7.2 10*3/uL (ref 4.0–10.5)

## 2014-01-10 LAB — TROPONIN I

## 2014-01-10 LAB — COMPREHENSIVE METABOLIC PANEL
ALK PHOS: 70 U/L (ref 39–117)
ALT: 19 U/L (ref 0–35)
AST: 21 U/L (ref 0–37)
Albumin: 4 g/dL (ref 3.5–5.2)
BILIRUBIN TOTAL: 0.3 mg/dL (ref 0.3–1.2)
BUN: 14 mg/dL (ref 6–23)
CHLORIDE: 104 meq/L (ref 96–112)
CO2: 24 mEq/L (ref 19–32)
CREATININE: 0.73 mg/dL (ref 0.50–1.10)
Calcium: 9.7 mg/dL (ref 8.4–10.5)
GFR calc non Af Amer: >90 mL/min (ref >90)
GLUCOSE: 134 mg/dL — AB (ref 70–99)
POTASSIUM: 4 meq/L (ref 3.7–5.3)
Sodium: 141 mEq/L (ref 137–147)
TOTAL PROTEIN: 8.1 g/dL (ref 6.0–8.3)

## 2014-01-10 LAB — URINALYSIS, ROUTINE W REFLEX MICROSCOPIC
BILIRUBIN URINE: NEGATIVE
Glucose, UA: NEGATIVE mg/dL
Hgb urine dipstick: NEGATIVE
KETONES UR: NEGATIVE mg/dL
Leukocytes, UA: NEGATIVE
NITRITE: NEGATIVE
Protein, ur: NEGATIVE mg/dL
Specific Gravity, Urine: 1.01 (ref 1.005–1.030)
UROBILINOGEN UA: 0.2 mg/dL (ref 0.0–1.0)
pH: 5.5 (ref 5.0–8.0)

## 2014-01-10 MED ORDER — IOHEXOL 350 MG/ML SOLN
150.0000 mL | Freq: Once | INTRAVENOUS | Status: AC | PRN
  Administered 2014-01-10: 150 mL via INTRAVENOUS

## 2014-01-10 NOTE — ED Notes (Signed)
Pt alert & oriented x4, stable gait. Patient given discharge instructions, paperwork & prescription(s). Patient  instructed to stop at the registration desk to finish any additional paperwork. Patient verbalized understanding. Pt left department w/ no further questions. 

## 2014-01-10 NOTE — ED Notes (Signed)
Pt with left sided CP with nausea and mild SOB since 1400 today, pt states right vision changes ofr several months, left facial numbness that comes and goes for awhile, dizziness off and on as well, states arms and legs stay numb per pt

## 2014-01-10 NOTE — ED Provider Notes (Signed)
CSN: [id number]     Arrival date & time 01/10/14  2000 History   First MD Initiated Contact with Patient 01/10/14 2011 This chart was scribed for Rolland PorterMark Tyler Cubit, MD by Valera CastleSteven Perry, ED Scribe. This patient was seen in room APA10/APA10 and the patient's care was started at 8:44 PM.     Chief Complaint  Patient presents with  . Chest Pain   (Consider location/radiation/quality/duration/timing/severity/associated sxs/prior Treatment) The history is provided by the patient. No language interpreter was used.   HPI Comments: Vanessa RiserDebra Vora is a 53 y.o. female with h/o DM, obesity, and heart murmur, who presents to the Emergency Department complaining of intermittent, pulling, left sided neck pain, and intermittent, lower, left sided chest pain, onset 1400 today.   Pt reports for the past several months she has been having intermittent, left facial numbness, left sided vision changes, headache, palpitations, and dizziness. She states her episodes were happening once a week, but recently they have been daily. She states her episodes will last only a few minutes.   She reports at baseline her arms and legs are numb, onset several months ago. She reports her right eye vision has also been declining in the past several weeks. She denies any other associated symptoms. She denies h/o smoking. She reports her mother has severe heart disease and her brother passed away last Fall from a heart attack.   A multitude of complaints.  Her main reasons for presentation tonight is her chest pain.  PCP - No PCP Per Patient  Past Medical History  Diagnosis Date  . Cellulitis   . Hypertension   . Heart murmur   . MRSA (methicillin resistant staph aureus) culture positive    Past Surgical History  Procedure Laterality Date  . Hernia repair    . Appendectomy    . Cholecystectomy     History reviewed. No pertinent family history. History  Substance Use Topics  . Smoking status: Never Smoker   . Smokeless tobacco:  Not on file  . Alcohol Use: No   OB History   Grav Para Term Preterm Abortions TAB SAB Ect Mult Living                 Review of Systems  Constitutional: Negative for fever, chills, diaphoresis, appetite change and fatigue.  HENT: Negative for mouth sores, sore throat and trouble swallowing.   Eyes: Positive for visual disturbance (left sided).  Respiratory: Positive for shortness of breath (mild). Negative for cough, chest tightness and wheezing.   Cardiovascular: Positive for chest pain (left, lower).  Gastrointestinal: Positive for nausea. Negative for vomiting, abdominal pain, diarrhea and abdominal distention.  Endocrine: Negative for polydipsia, polyphagia and polyuria.  Genitourinary: Negative for dysuria, frequency and hematuria.  Musculoskeletal: Positive for neck pain (left sided). Negative for gait problem.  Skin: Negative for color change, pallor and rash.  Neurological: Positive for dizziness, numbness (left face) and headaches. Negative for syncope and light-headedness.  Hematological: Does not bruise/bleed easily.  Psychiatric/Behavioral: Negative for behavioral problems and confusion.   Allergies  Other; Latex; Levaquin; and Penicillins  Home Medications   Prior to Admission medications   Medication Sig Start Date End Date Taking? Authorizing Provider  acetaminophen (TYLENOL) 500 MG tablet Take 500 mg by mouth every 6 (six) hours as needed for pain.    Historical Provider, MD  diphenhydrAMINE (BENADRYL) 25 MG tablet Take 25 mg by mouth daily as needed for itching or allergies.     Historical Provider, MD  levothyroxine (  SYNTHROID, LEVOTHROID) 25 MCG tablet Take 1 tablet (25 mcg total) by mouth daily before breakfast. 01/01/13   Nimish Normajean Glasgow Gosrani, MD  Naphazoline-Polyethyl Glycol (EYE DROPS) 0.012-0.2 % SOLN Apply 1 drop to eye daily as needed (for itching/allergy/symptoms).    Historical Provider, MD  oxyCODONE (OXY IR/ROXICODONE) 5 MG immediate release tablet Take 1  tablet (5 mg total) by mouth every 4 (four) hours as needed. 01/01/13   Nimish Normajean Glasgow Gosrani, MD  sulfamethoxazole-trimethoprim (BACTRIM DS) 800-160 MG per tablet Take 1 tablet by mouth 2 (two) times daily. 01/01/13   Nimish Normajean Glasgow Gosrani, MD   BP 165/109  Temp(Src) 97.8 F (36.6 C) (Oral)  Resp 18  Ht 5\' 3"  (1.6 m)  Wt 320 lb (145.151 kg)  BMI 56.70 kg/m2  SpO2 98%  Physical Exam  Constitutional: She is oriented to person, place, and time. She appears well-developed and well-nourished. No distress.  Morbid obesity.   HENT:  Head: Normocephalic.  Eyes: Conjunctivae and EOM are normal. Pupils are equal, round, and reactive to light. No scleral icterus. Right eye exhibits no nystagmus. Left eye exhibits no nystagmus.  Neck: Trachea normal and normal range of motion. Neck supple. Normal carotid pulses present. Carotid bruit is not present. No thyromegaly present.  Cardiovascular: Normal rate and regular rhythm.  Exam reveals no gallop and no friction rub.   Murmur heard. 2/6 systolic murmur that radiates to base of the neck.   Pulmonary/Chest: Effort normal and breath sounds normal. No respiratory distress. She has no wheezes. She has no rales.  Musculoskeletal: Normal range of motion.  Neurological: She is alert and oriented to person, place, and time. No cranial nerve deficit. She exhibits normal muscle tone. Coordination normal.  Intact. No deficits.   Skin: Skin is warm and dry. No rash noted.  Psychiatric: She has a normal mood and affect. Her behavior is normal.    ED Course  Procedures (including critical care time)  DIAGNOSTIC STUDIES: Oxygen Saturation is 98% on room air, normal by my interpretation.    COORDINATION OF CARE: 8:49 PM-Discussed treatment plan which includes CXR, blood work, and CT head with pt at bedside and pt agreed to plan.   Results for orders placed during the hospital encounter of 01/10/14  CBC WITH DIFFERENTIAL      Result Value Ref Range   WBC 7.2  4.0 -  10.5 K/uL   RBC 4.28  3.87 - 5.11 MIL/uL   Hemoglobin 12.2  12.0 - 15.0 g/dL   HCT 16.137.0  09.636.0 - 04.546.0 %   MCV 86.4  78.0 - 100.0 fL   MCH 28.5  26.0 - 34.0 pg   MCHC 33.0  30.0 - 36.0 g/dL   RDW 40.913.8  81.111.5 - 91.415.5 %   Platelets 265  150 - 400 K/uL   Neutrophils Relative % 57  43 - 77 %   Neutro Abs 4.1  1.7 - 7.7 K/uL   Lymphocytes Relative 33  12 - 46 %   Lymphs Abs 2.4  0.7 - 4.0 K/uL   Monocytes Relative 9  3 - 12 %   Monocytes Absolute 0.6  0.1 - 1.0 K/uL   Eosinophils Relative 1  0 - 5 %   Eosinophils Absolute 0.1  0.0 - 0.7 K/uL   Basophils Relative 0  0 - 1 %   Basophils Absolute 0.0  0.0 - 0.1 K/uL  COMPREHENSIVE METABOLIC PANEL      Result Value Ref Range   Sodium 141  137 - 147 mEq/L   Potassium 4.0  3.7 - 5.3 mEq/L   Chloride 104  96 - 112 mEq/L   CO2 24  19 - 32 mEq/L   Glucose, Bld 134 (*) 70 - 99 mg/dL   BUN 14  6 - 23 mg/dL   Creatinine, Ser 1.61  0.50 - 1.10 mg/dL   Calcium 9.7  8.4 - 09.6 mg/dL   Total Protein 8.1  6.0 - 8.3 g/dL   Albumin 4.0  3.5 - 5.2 g/dL   AST 21  0 - 37 U/L   ALT 19  0 - 35 U/L   Alkaline Phosphatase 70  39 - 117 U/L   Total Bilirubin 0.3  0.3 - 1.2 mg/dL   GFR calc non Af Amer >90  >90 mL/min   GFR calc Af Amer >90  >90 mL/min  TROPONIN I      Result Value Ref Range   Troponin I <0.30  <0.30 ng/mL  URINALYSIS, ROUTINE W REFLEX MICROSCOPIC      Result Value Ref Range   Color, Urine YELLOW  YELLOW   APPearance CLEAR  CLEAR   Specific Gravity, Urine 1.010  1.005 - 1.030   pH 5.5  5.0 - 8.0   Glucose, UA NEGATIVE  NEGATIVE mg/dL   Hgb urine dipstick NEGATIVE  NEGATIVE   Bilirubin Urine NEGATIVE  NEGATIVE   Ketones, ur NEGATIVE  NEGATIVE mg/dL   Protein, ur NEGATIVE  NEGATIVE mg/dL   Urobilinogen, UA 0.2  0.0 - 1.0 mg/dL   Nitrite NEGATIVE  NEGATIVE   Leukocytes, UA NEGATIVE  NEGATIVE   Ct Head Wo Contrast  01/10/2014   CLINICAL DATA:  Diabetic, LEFT neck pain and pulling, visual changes, numbness in extremities, headache,  vertigo, history hypertension  EXAM: CT HEAD WITHOUT CONTRAST  TECHNIQUE: Contiguous axial images were obtained from the base of the skull through the vertex without intravenous contrast.  COMPARISON:  None  FINDINGS: Normal ventricular morphology.  No midline shift or mass effect.  Question subtle hypoattenuation at LEFT occipital lobe, could potentially be artifactual but cannot exclude infarct.  No intracranial hemorrhage or mass lesion.  No extra-axial fluid collections.  Orbital soft tissue planes clear.  Bones and sinuses unremarkable.  IMPRESSION: Subtle hypoattenuation at LEFT occipital lobe question, potentially artifact but cannot exclude subtle acute infarct.  Remainder of exam unremarkable.   Electronically Signed   By: Ulyses Southward M.D.   On: 01/10/2014 22:02   Dg Chest Portable 1 View  01/10/2014   CLINICAL DATA:  Chest pain  EXAM: PORTABLE CHEST - 1 VIEW  COMPARISON:  Prior radiograph from 12/29/2012  FINDINGS: Cardiomegaly is stable. Mediastinal silhouette within normal limits.  The lungs are normally inflated. No airspace consolidation, pleural effusion, or pulmonary edema is identified. There is no pneumothorax.  No acute osseous abnormality identified.  IMPRESSION: 1. No acute cardiopulmonary abnormality. 2. Stable cardiomegaly without pulmonary edema.   Electronically Signed   By: Rise Mu M.D.   On: 01/10/2014 21:01   Ct Angio Chest Aorta W/cm &/or Wo/cm  01/10/2014   CLINICAL DATA:  Pulling at LEFT side of neck, numbness in extremities, LEFT chest pain since 1400 hr, history diabetes, hypertension  EXAM: CT ANGIOGRAPHY CHEST WITH CONTRAST  TECHNIQUE: Multidetector CT imaging of the chest was performed using the standard protocol during bolus administration of intravenous contrast. Multiplanar CT image reconstructions and MIPs were obtained to evaluate the vascular anatomy. Pre contrast images of the thorax were obtained.  CONTRAST:  OMNIPAQUE IOHEXOL 350 MG/ML SOLN IV   COMPARISON:  None  FINDINGS: Atherosclerotic calcification at the aortic valve region on precontrast images.  No evidence of aortic high attenuation present to suggest intramural hematoma.  Gallbladder surgically absent.  Visualized portion of upper abdomen unremarkable.  Following contrast, normal caliber aorta without aneurysm or dissection.  Pulmonary arteries grossly patent on nondedicated exam.  No thoracic adenopathy.  Lungs clear.  No infiltrate, pleural effusion, pneumothorax, or mass/nodule.  Bones unremarkable.  Review of the MIP images confirms the above findings.  IMPRESSION: Negative CTA chest.   Electronically Signed   By: Ulyses Southward M.D.   On: 01/10/2014 22:13    EKG Interpretation None     Medications  iohexol (OMNIPAQUE) 350 MG/ML injection 150 mL (150 mLs Intravenous Contrast Given 01/10/14 2142)   MDM   Final diagnoses:  Chest pain   CT angiogram shows no pulmonary embolus. Normal aorta. No dissection. EKG shows slow R wave progression. She's been symptomatic with her chest over 24 hours, and has a normal troponin. CT scan shows artifact left occipital lobe. Differential does include acute stroke. On her exam now and in my reevaluation for her she has no symptoms regarding vision changes, vertigo, posterior circulation abnormalities. We discussed options of staying in the hospital for MRI and further evaluation. She is hesitant to do so she states "I was mainly here about my chest".  I've given her neurologist in town to followup with. With her month's worth of symptoms of numbness to her extremities and intermittent and fleeting vision changes and being female she may need outpatient MRI to further rule out a mass. I've asked her to encourage her to followup with a primary care physician. As always, return to emergency with acute changes.  I personally performed the services described in this documentation, which was scribed in my presence. The recorded information has been  reviewed and is accurate.    [name], MD 01/10/14 [id number]

## 2014-01-10 NOTE — Discharge Instructions (Signed)
A subtle abnormality was noted on your CT scan tonight. Your exam and symptoms do not suggest that this is from a stroke stroke. Take an aspirin every day. We are referring you to a neurologist in town regarding your symptoms. You should obtain a primary care physician for most of your ongoing health care. Return to the emergency room for any acute problems.  Chest Pain (Nonspecific) It is often hard to give a specific diagnosis for the cause of chest pain. There is always a chance that your pain could be related to something serious, such as a heart attack or a blood clot in the lungs. You need to follow up with your caregiver for further evaluation. CAUSES   Heartburn.  Pneumonia or bronchitis.  Anxiety or stress.  Inflammation around your heart (pericarditis) or lung (pleuritis or pleurisy).  A blood clot in the lung.  A collapsed lung (pneumothorax). It can develop suddenly on its own (spontaneous pneumothorax) or from injury (trauma) to the chest.  Shingles infection (herpes zoster virus). The chest wall is composed of bones, muscles, and cartilage. Any of these can be the source of the pain.  The bones can be bruised by injury.  The muscles or cartilage can be strained by coughing or overwork.  The cartilage can be affected by inflammation and become sore (costochondritis). DIAGNOSIS  Lab tests or other studies, such as X-rays, electrocardiography, stress testing, or cardiac imaging, may be needed to find the cause of your pain.  TREATMENT   Treatment depends on what may be causing your chest pain. Treatment may include:  Acid blockers for heartburn.  Anti-inflammatory medicine.  Pain medicine for inflammatory conditions.  Antibiotics if an infection is present.  You may be advised to change lifestyle habits. This includes stopping smoking and avoiding alcohol, caffeine, and chocolate.  You may be advised to keep your head raised (elevated) when sleeping. This  reduces the chance of acid going backward from your stomach into your esophagus.  Most of the time, nonspecific chest pain will improve within 2 to 3 days with rest and mild pain medicine. HOME CARE INSTRUCTIONS   If antibiotics were prescribed, take your antibiotics as directed. Finish them even if you start to feel better.  For the next few days, avoid physical activities that bring on chest pain. Continue physical activities as directed.  Do not smoke.  Avoid drinking alcohol.  Only take over-the-counter or prescription medicine for pain, discomfort, or fever as directed by your caregiver.  Follow your caregiver's suggestions for further testing if your chest pain does not go away.  Keep any follow-up appointments you made. If you do not go to an appointment, you could develop lasting (chronic) problems with pain. If there is any problem keeping an appointment, you must call to reschedule. SEEK MEDICAL CARE IF:   You think you are having problems from the medicine you are taking. Read your medicine instructions carefully.  Your chest pain does not go away, even after treatment.  You develop a rash with blisters on your chest. SEEK IMMEDIATE MEDICAL CARE IF:   You have increased chest pain or pain that spreads to your arm, neck, jaw, back, or abdomen.  You develop shortness of breath, an increasing cough, or you are coughing up blood.  You have severe back or abdominal pain, feel nauseous, or vomit.  You develop severe weakness, fainting, or chills.  You have a fever. THIS IS AN EMERGENCY. Do not wait to see if the pain  will go away. Get medical help at once. Call your local emergency services (911 in [place] Do not drive yourself to the hospital. MAKE SURE YOU:   Understand these instructions.  Will watch your condition.  Will get help right away if you are not doing well or get worse. Document Released: 05/21/2005 Document Revised: 11/03/2011 Document Reviewed:  03/16/2008 [facility] Patient Information 2014 [employer], [place].

## 2014-04-21 ENCOUNTER — Emergency Department (HOSPITAL_COMMUNITY)
Admission: EM | Admit: 2014-04-21 | Discharge: 2014-04-21 | Disposition: A | Discharge status: Home / Self Care | Payer: BC Managed Care – PPO | Attending: Emergency Medicine | Admitting: Emergency Medicine

## 2014-04-21 ENCOUNTER — Encounter (HOSPITAL_COMMUNITY): Payer: Self-pay | Admitting: Emergency Medicine

## 2014-04-21 DIAGNOSIS — I10 Essential (primary) hypertension: Secondary | ICD-10-CM | POA: Insufficient documentation

## 2014-04-21 DIAGNOSIS — Z8614 Personal history of Methicillin resistant Staphylococcus aureus infection: Secondary | ICD-10-CM | POA: Insufficient documentation

## 2014-04-21 DIAGNOSIS — E119 Type 2 diabetes mellitus without complications: Secondary | ICD-10-CM | POA: Insufficient documentation

## 2014-04-21 DIAGNOSIS — L97929 Non-pressure chronic ulcer of unspecified part of left lower leg with unspecified severity: Secondary | ICD-10-CM

## 2014-04-21 DIAGNOSIS — Z872 Personal history of diseases of the skin and subcutaneous tissue: Secondary | ICD-10-CM | POA: Insufficient documentation

## 2014-04-21 DIAGNOSIS — E669 Obesity, unspecified: Secondary | ICD-10-CM | POA: Insufficient documentation

## 2014-04-21 DIAGNOSIS — R000 Tachycardia, unspecified: Secondary | ICD-10-CM | POA: Insufficient documentation

## 2014-04-21 DIAGNOSIS — Z91040 Latex allergy status: Secondary | ICD-10-CM | POA: Insufficient documentation

## 2014-04-21 DIAGNOSIS — M79609 Pain in unspecified limb: Secondary | ICD-10-CM | POA: Insufficient documentation

## 2014-04-21 DIAGNOSIS — L97909 Non-pressure chronic ulcer of unspecified part of unspecified lower leg with unspecified severity: Secondary | ICD-10-CM | POA: Insufficient documentation

## 2014-04-21 DIAGNOSIS — R21 Rash and other nonspecific skin eruption: Secondary | ICD-10-CM | POA: Insufficient documentation

## 2014-04-21 DIAGNOSIS — R011 Cardiac murmur, unspecified: Secondary | ICD-10-CM | POA: Insufficient documentation

## 2014-04-21 DIAGNOSIS — T7840XA Allergy, unspecified, initial encounter: Secondary | ICD-10-CM

## 2014-04-21 DIAGNOSIS — T368X5A Adverse effect of other systemic antibiotics, initial encounter: Secondary | ICD-10-CM | POA: Insufficient documentation

## 2014-04-21 DIAGNOSIS — Z880 Allergy status to penicillin: Secondary | ICD-10-CM | POA: Insufficient documentation

## 2014-04-21 HISTORY — DX: Type 2 diabetes mellitus without complications: E11.9

## 2014-04-21 LAB — CBC WITH DIFFERENTIAL/PLATELET
BASOS ABS: 0 10*3/uL (ref 0.0–0.1)
BASOS PCT: 1 % (ref 0–1)
EOS ABS: 0.4 10*3/uL (ref 0.0–0.7)
Eosinophils Relative: 5 % (ref 0–5)
HCT: 36.8 % (ref 36.0–46.0)
Hemoglobin: 12.2 g/dL (ref 12.0–15.0)
Lymphocytes Relative: 27 % (ref 12–46)
Lymphs Abs: 1.9 10*3/uL (ref 0.7–4.0)
MCH: 29.2 pg (ref 26.0–34.0)
MCHC: 33.2 g/dL (ref 30.0–36.0)
MCV: 88 fL (ref 78.0–100.0)
MONOS PCT: 7 % (ref 3–12)
Monocytes Absolute: 0.5 10*3/uL (ref 0.1–1.0)
NEUTROS PCT: 60 % (ref 43–77)
Neutro Abs: 4.3 10*3/uL (ref 1.7–7.7)
PLATELETS: 296 10*3/uL (ref 150–400)
RBC: 4.18 MIL/uL (ref 3.87–5.11)
RDW: 13.8 % (ref 11.5–15.5)
WBC: 7.1 10*3/uL (ref 4.0–10.5)

## 2014-04-21 LAB — BASIC METABOLIC PANEL
Anion gap: 13 (ref 5–15)
BUN: 19 mg/dL (ref 6–23)
CO2: 28 mEq/L (ref 19–32)
Calcium: 9.5 mg/dL (ref 8.4–10.5)
Chloride: 98 mEq/L (ref 96–112)
Creatinine, Ser: 1.04 mg/dL (ref 0.50–1.10)
GFR, EST AFRICAN AMERICAN: 69 mL/min — AB (ref >90)
GFR, EST NON AFRICAN AMERICAN: 59 mL/min — AB (ref >90)
Glucose, Bld: 191 mg/dL — ABNORMAL HIGH (ref 70–99)
POTASSIUM: 3.9 meq/L (ref 3.7–5.3)
SODIUM: 139 meq/L (ref 137–147)

## 2014-04-21 MED ORDER — HYDROMORPHONE HCL PF 1 MG/ML IJ SOLN
1.0000 mg | Freq: Once | INTRAMUSCULAR | Status: AC
  Administered 2014-04-21: 1 mg via INTRAVENOUS
  Filled 2014-04-21: qty 1

## 2014-04-21 MED ORDER — ONDANSETRON HCL 4 MG/2ML IJ SOLN
4.0000 mg | Freq: Once | INTRAMUSCULAR | Status: AC
  Administered 2014-04-21: 4 mg via INTRAVENOUS
  Filled 2014-04-21: qty 2

## 2014-04-21 MED ORDER — SODIUM CHLORIDE 0.9 % IV BOLUS (SEPSIS)
1000.0000 mL | Freq: Once | INTRAVENOUS | Status: AC
  Administered 2014-04-21: 1000 mL via INTRAVENOUS

## 2014-04-21 MED ORDER — SULFAMETHOXAZOLE-TRIMETHOPRIM 800-160 MG PO TABS: 1.0000 | ORAL_TABLET | Freq: Two times a day (BID) | ORAL | Status: DC

## 2014-04-21 MED ORDER — SULFAMETHOXAZOLE-TMP DS 800-160 MG PO TABS
1.0000 | ORAL_TABLET | Freq: Once | ORAL | Status: AC
Start: 2014-04-21 — End: 2014-04-21
  Administered 2014-04-21: 1 via ORAL
  Filled 2014-04-21: qty 1

## 2014-04-21 MED ORDER — HYDROCODONE-ACETAMINOPHEN 5-325 MG PO TABS: 2.0000 | ORAL_TABLET | ORAL | Status: DC | PRN

## 2014-04-21 NOTE — Discharge Instructions (Signed)
If you were given medicines take as directed.  If you are on coumadin or contraceptives realize their levels and effectiveness is altered by many different medicines.  If you have any reaction (rash, tongues swelling, other) to the medicines stop taking and see a physician.   Please follow up as directed and return to the ER or see a physician for new or worsening symptoms.  Thank you. Filed Vitals:   04/21/14 1843  BP: 117/96  Pulse: 110  Temp: 98.9 F (37.2 C)  TempSrc: Oral  Resp: 18  Height:  (1.6 m)  Weight: 300 lb (136.079 kg)  SpO2: 100%

## 2014-04-21 NOTE — ED Notes (Signed)
Pt had taking clindamycin for infection in her lt leg,  Even though she knew that she had had reactions to clindamycin in the past. Pt was in Er at [facility] app 2 weeks ago for lt leg infection , Stayed overnight and received IV antibiotics and sent home with antibiotics.  Lt leg began hurting again , so pt started taking the clindamycin and developed rash and blisters on rt lower leg. Pt says rash is much better, but concerned about the blisters on her rt leg.

## 2014-04-21 NOTE — ED Provider Notes (Signed)
CSN: [id number]     Arrival date & time 04/21/14  1834 History   First MD Initiated Contact with Patient 04/21/14 1851     Chief Complaint  Patient presents with  . Leg Pain     (Consider location/radiation/quality/duration/timing/severity/associated sxs/prior Treatment) HPI Comments: 53 year old female with history of cellulitis, obesity, high blood pressure, MRSA, diabetes presents with skin rash.  Patient was at [facility] approximately 2 weeks prior for IV antibiotics and sent home with oral antibiotics. 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 Patient started taking clindamycin again approximately one week ago do 2 worsening pain in the left leg however she developed a diffuse sunburn-like rash with blisters on the right lower leg that has had mild improvement in the past 2 and half days. Patient denies oral lesions or palmar or sole lesions. Patient has chronic leg swelling. Patient denies fevers chills or vomiting. Mild tender to palpation. Patient stopped taking clindamycin 3 days ago.  Patient is a 53 y.o. female presenting with leg pain. The history is provided by the patient.  Leg Pain Associated symptoms: no back pain, no fever and no neck pain     Past Medical History  Diagnosis Date  . Cellulitis   . Hypertension   . Heart murmur   . MRSA (methicillin resistant staph aureus) culture positive   . Diabetes mellitus without complication    Past Surgical History  Procedure Laterality Date  . Hernia repair    . Appendectomy    . Cholecystectomy     History reviewed. No pertinent family history. History  Substance Use Topics  . Smoking status: Never Smoker   . Smokeless tobacco: Not on file  . Alcohol Use: No   OB History   Grav Para Term Preterm Abortions TAB SAB Ect Mult Living                 Review of Systems  Constitutional: Positive for appetite change. Negative for fever and  chills.  HENT: Negative for congestion.   Eyes: Negative for visual disturbance.  Respiratory: Negative for shortness of breath.   Cardiovascular: Negative for chest pain.  Gastrointestinal: Negative for vomiting and abdominal pain.  Genitourinary: Negative for dysuria and flank pain.  Musculoskeletal: Negative for back pain, neck pain and neck stiffness.  Skin: Positive for color change, rash and wound.  Neurological: Negative for light-headedness and headaches.      Allergies  Other; Clindamycin/lincomycin; Latex; Levaquin; and Penicillins  Home Medications   Prior to Admission medications   Medication Sig Start Date End Date Taking? Authorizing Provider  acetaminophen (TYLENOL) 500 MG tablet Take 500 mg by mouth every 6 (six) hours as needed for pain.   Yes Historical Provider, MD  aspirin EC 81 MG tablet Take 162 mg by mouth once as needed for moderate pain (for chest pain).   Yes Historical Provider, MD  diphenhydrAMINE (BENADRYL) 25 mg capsule Take 25 mg by mouth every 6 (six) hours.   Yes Historical Provider, MD   BP 117/96  Pulse 110  Temp(Src) 98.9 F (37.2 C) (Oral)  Resp 18  Ht  (1.6 m)  Wt 300 lb (136.079 kg)  BMI 53.16 kg/m2  SpO2 100% Physical Exam  Nursing note and vitals reviewed. Constitutional: She is oriented to person, place, and time. She appears well-developed and well-nourished. No distress.  HENT:  Head: Normocephalic and atraumatic.  No oral lesions, no  palm or sole lesions.  Eyes: Conjunctivae are normal. Right eye exhibits no discharge. Left eye exhibits no discharge.  Neck: Normal range of motion. Neck supple. No tracheal deviation present.  Cardiovascular: Regular rhythm.  Tachycardia present.   Pulmonary/Chest: Effort normal.  Abdominal: Soft. She exhibits no distension. There is no tenderness. There is no guarding.  Musculoskeletal: She exhibits edema and tenderness.  Neurological: She is alert and oriented to person, place, and time.   Skin: Skin is warm. Rash noted.  Patient has chronic leg edema bilateral lower extremities. Patient has superficial ulcer with scabbing to left anterior lower tibia, mild tender to palpation, no significant discharge crepitus or streaking erythema. Mild warmth centrally. Patient has blisters to right lower chest remedies. No crepitus, no ecchymosis/petechiae or purpura. No significant tenderness.  Psychiatric: She has a normal mood and affect.    ED Course  Procedures (including critical care time) Labs Review Labs Reviewed  BASIC METABOLIC PANEL - Abnormal; Notable for the following:    Glucose, Bld 191 (*)    GFR calc non Af Amer 59 (*)    GFR calc Af Amer 69 (*)    All other components within normal limits  CBC WITH DIFFERENTIAL    Imaging Review No results found.   EKG Interpretation None      MDM   Final diagnoses:  Leg ulcer, left, with unspecified severity  Skin rash  Allergic reaction to drug   Patient with chronic leg swelling chronic skin ulcer presents with worsening left leg pain and skin rash since taking clindamycin. Patient stopped taking clindamycin recommended likely allergy from clindamycin. No oral lesions, vitals unremarkable except for mild tachycardia likely pain related. Blood work unremarkable. Patient improved in ER and discussed very close followup outpatient and reasons to return. Plan to take Septra in place of clindamycin.  No concern for emergent infection at this time. Results and differential diagnosis were discussed with the patient/parent/guardian. Close follow up outpatient was discussed, comfortable with the plan.   Medications  sulfamethoxazole-trimethoprim (BACTRIM DS) 800-160 MG per tablet 1 tablet (1 tablet Oral Given 04/21/14 2003)  ondansetron [facility]) injection 4 mg (4 mg Intravenous Given 04/21/14 2024)  sodium chloride 0.9 % bolus 1,000 mL (1,000 mLs Intravenous New Bag/Given 04/21/14 2028)  HYDROmorphone (DILAUDID) injection 1 mg  (1 mg Intravenous Given 04/21/14 2024)    Filed Vitals:   04/21/14 1843  BP: 117/96  Pulse: 110  Temp: 98.9 F (37.2 C)  TempSrc: Oral  Resp: 18  Height:  (1.6 m)  Weight: 300 lb (136.079 kg)  SpO2: 100%       Enid Skeens, MD 04/21/14 2140

## 2014-04-26 ENCOUNTER — Ambulatory Visit: Payer: Self-pay | Admitting: Family Medicine

## 2014-04-26 ENCOUNTER — Encounter: Payer: Self-pay | Admitting: Family Medicine

## 2014-04-26 VITALS — BP 139/94 | HR 110 | Temp 98.3°F | Ht 63.0 in | Wt 333.0 lb

## 2014-04-26 DIAGNOSIS — S91009A Unspecified open wound, unspecified ankle, initial encounter: Principal | ICD-10-CM

## 2014-04-26 DIAGNOSIS — S81809A Unspecified open wound, unspecified lower leg, initial encounter: Principal | ICD-10-CM

## 2014-04-26 DIAGNOSIS — R739 Hyperglycemia, unspecified: Secondary | ICD-10-CM

## 2014-04-26 DIAGNOSIS — S81009A Unspecified open wound, unspecified knee, initial encounter: Secondary | ICD-10-CM

## 2014-04-26 DIAGNOSIS — R7309 Other abnormal glucose: Secondary | ICD-10-CM

## 2014-04-26 LAB — POCT GLYCOSYLATED HEMOGLOBIN (HGB A1C): Hemoglobin A1C: 6.8

## 2014-04-26 MED ORDER — METFORMIN HCL 500 MG PO TABS: 500.0000 mg | ORAL_TABLET | Freq: Two times a day (BID) | ORAL | Status: DC

## 2014-04-26 MED ORDER — HYDROCODONE-ACETAMINOPHEN 5-325 MG PO TABS: 2.0000 | ORAL_TABLET | ORAL | Status: DC | PRN

## 2014-04-26 MED ORDER — FUROSEMIDE 40 MG PO TABS: 40.0000 mg | ORAL_TABLET | ORAL | Status: AC | PRN

## 2014-04-26 NOTE — Progress Notes (Signed)
Hydrocodone script accidentally printed with #6. That script was shredded and reordered for #20 per Dr Hyacinth Meeker. Script signed and given to patient.

## 2014-04-26 NOTE — Progress Notes (Signed)
   Subjective:    Patient ID: Vanessa Vega, female    DOB: 1961-03-04, 53 y.o.   MRN: [id number]  HPI  53 year old female here as a followup emergency room visit. This lady does not have health insurance and has not received adequate medical care for some time. She has a history of leg wounds. Currently both legs are scabbed over with eschar and there is no open wound. She did see the emergency room physician recently for some blisters on her right leg that she felt was a reaction to clindamycin. She is obese and it is felt she probably has diabetes although I'm not sure that's ever been officially diagnosed. Review of her labs from the emergency room visit show a random blood sugar 191, normal electrolytes, creatinine of 1.05 although the EGFR is in the 60 range.    Review of Systems  Constitutional: Negative.   HENT: Negative.   Eyes: Negative.   Respiratory: Negative.   Cardiovascular: Positive for leg swelling.  Endocrine: Negative.   Genitourinary: Negative.  Genital sores: bilateral.  Neurological: Negative.   Psychiatric/Behavioral: Negative.        Objective:   Physical Exam  Constitutional: She is oriented to person, place, and time.  Obese  HENT:  Head: Normocephalic.  Eyes: Pupils are equal, round, and reactive to light.  Cardiovascular: Normal rate.   Musculoskeletal: Edema: 3+ lower legs;  Neurological: She is alert and oriented to person, place, and time.    BP 139/94  Pulse 110  Temp(Src) 98.3 F (36.8 C) (Oral)  Ht $R'5\' 3"'xm$  (1.6 m)  Wt 333 lb (151.048 kg)  BMI 59.00 kg/m2      Assessment & Plan:  1. Open wound of knee, leg (except thigh), and ankle, complicated, unspecified laterality, initial encounter Wounds have been open but are now dry.  I feel that wraps might cause breakdown and we should leave them alone  2. Hyperglycemia Check A1C but  I will begin metformin and follow - POCT glycosylated hemoglobin (Hb A1C)  Wardell Honour MD

## 2014-04-26 NOTE — Patient Instructions (Signed)

## 2014-05-26 ENCOUNTER — Ambulatory Visit: Payer: Self-pay | Admitting: Family Medicine

## 2014-05-26 ENCOUNTER — Encounter: Payer: Self-pay | Admitting: Family Medicine

## 2014-05-26 VITALS — BP 149/101 | HR 96 | Temp 98.4°F | Ht 63.0 in | Wt 330.0 lb

## 2014-05-26 DIAGNOSIS — S91002A Unspecified open wound, left ankle, initial encounter: Secondary | ICD-10-CM

## 2014-05-26 DIAGNOSIS — S81802A Unspecified open wound, left lower leg, initial encounter: Secondary | ICD-10-CM

## 2014-05-26 DIAGNOSIS — S81002A Unspecified open wound, left knee, initial encounter: Secondary | ICD-10-CM

## 2014-05-26 MED ORDER — HYDROCODONE-ACETAMINOPHEN 5-325 MG PO TABS: 2.0000 | ORAL_TABLET | ORAL | Status: AC | PRN

## 2014-05-26 NOTE — Patient Instructions (Addendum)
Consider Tdap  Tdap Vaccine (Tetanus, Diphtheria, Pertussis): What You Need to Know 1. Why get vaccinated? Tetanus, diphtheria and pertussis can be very serious diseases, even for adolescents and adults. Tdap vaccine can protect us from these diseases. TETANUS (Lockjaw) causes painful muscle tightening and stiffness, usually all over the body.  It can lead to tightening of muscles in the head and neck so you can't open your mouth, swallow, or sometimes even breathe. Tetanus kills about 1 out of 5 people who are infected. DIPHTHERIA can cause a thick coating to form in the back of the throat.  It can lead to breathing problems, paralysis, heart failure, and death. PERTUSSIS (Whooping Cough) causes severe coughing spells, which can cause difficulty breathing, vomiting and disturbed sleep.  It can also lead to weight loss, incontinence, and rib fractures. Up to 2 in 100 adolescents and 5 in 100 adults with pertussis are hospitalized or have complications, which could include pneumonia or death. These diseases are caused by bacteria. Diphtheria and pertussis are spread from person to person through coughing or sneezing. Tetanus enters the body through cuts, scratches, or wounds. Before vaccines, the [place] saw as many as 200,000 cases a year of diphtheria and pertussis, and hundreds of cases of tetanus. Since vaccination began, tetanus and diphtheria have dropped by about 99% and pertussis by about 80%. 2. Tdap vaccine Tdap vaccine can protect adolescents and adults from tetanus, diphtheria, and pertussis. One dose of Tdap is routinely given at age 55 or 2612. People who did not get Tdap at that age should get it as soon as possible. Tdap is especially important for health care professionals and anyone having close contact with a baby younger than 12 months. Pregnant women should get a dose of Tdap during every pregnancy, to protect the newborn from pertussis. Infants are most at risk for severe,  life-threatening complications from pertussis. A similar vaccine, called Td, protects from tetanus and diphtheria, but not pertussis. A Td booster should be given every 10 years. Tdap may be given as one of these boosters if you have not already gotten a dose. Tdap may also be given after a severe cut or burn to prevent tetanus infection. Your doctor can give you more information. Tdap may safely be given at the same time as other vaccines. 3. Some people should not get this vaccine  If you ever had a life-threatening allergic reaction after a dose of any tetanus, diphtheria, or pertussis containing vaccine, OR if you have a severe allergy to any part of this vaccine, you should not get Tdap. Tell your doctor if you have any severe allergies.  If you had a coma, or long or multiple seizures within 7 days after a childhood dose of DTP or DTaP, you should not get Tdap, unless a cause other than the vaccine was found. You can still get Td.  Talk to your doctor if you:  have epilepsy or another nervous system problem,  had severe pain or swelling after any vaccine containing diphtheria, tetanus or pertussis,  ever had Guillain-Barr Syndrome (GBS),  aren't feeling well on the day the shot is scheduled. 4. Risks of a vaccine reaction With any medicine, including vaccines, there is a chance of side effects. These are usually mild and go away on their own, but serious reactions are also possible. Brief fainting spells can follow a vaccination, leading to injuries from falling. Sitting or lying down for about 15 minutes can help prevent these. Tell your doctor  if you feel dizzy or light-headed, or have vision changes or ringing in the ears. Mild problems following Tdap (Did not interfere with activities)  Pain where the shot was given (about 3 in 4 adolescents or 2 in 3 adults)  Redness or swelling where the shot was given (about 1 person in 5)  Mild fever of at least 100.24F (up to about 1 in  25 adolescents or 1 in 100 adults)  Headache (about 3 or 4 people in 10)  Tiredness (about 1 person in 3 or 4)  Nausea, vomiting, diarrhea, stomach ache (up to 1 in 4 adolescents or 1 in 10 adults)  Chills, body aches, sore joints, rash, swollen glands (uncommon) Moderate problems following Tdap (Interfered with activities, but did not require medical attention)  Pain where the shot was given (about 1 in 5 adolescents or 1 in 100 adults)  Redness or swelling where the shot was given (up to about 1 in 16 adolescents or 1 in 25 adults)  Fever over 102F (about 1 in 100 adolescents or 1 in 250 adults)  Headache (about 3 in 20 adolescents or 1 in 10 adults)  Nausea, vomiting, diarrhea, stomach ache (up to 1 or 3 people in 100)  Swelling of the entire arm where the shot was given (up to about 3 in 100). Severe problems following Tdap (Unable to perform usual activities; required medical attention)  Swelling, severe pain, bleeding and redness in the arm where the shot was given (rare). A severe allergic reaction could occur after any vaccine (estimated less than 1 in a million doses). 5. What if there is a serious reaction? What should I look for?  Look for anything that concerns you, such as signs of a severe allergic reaction, very high fever, or behavior changes. Signs of a severe allergic reaction can include hives, swelling of the face and throat, difficulty breathing, a fast heartbeat, dizziness, and weakness. These would start a few minutes to a few hours after the vaccination. What should I do?  If you think it is a severe allergic reaction or other emergency that can't wait, call 9-1-1 or get the person to the nearest hospital. Otherwise, call your doctor.  Afterward, the reaction should be reported to the "Vaccine Adverse Event Reporting System" (VAERS). Your doctor might file this report, or you can do it yourself through the VAERS web site at www.vaers.LAgents.no, or by  calling 1-8108331290. VAERS is only for reporting reactions. They do not give medical advice.  6. The National Vaccine Injury Compensation Program The [employer] Vaccine Injury Compensation Program (VICP) is a federal program that was created to compensate people who may have been injured by certain vaccines. Persons who believe they may have been injured by a vaccine can learn about the program and about filing a claim by calling 1-(212) 310-2773 or visiting the VICP website at SpiritualWord.at. 7. How can I learn more?  Ask your doctor.  Call your local or state health department.  Contact the Centers for Disease Control and Prevention (CDC):  Call 717-854-5891 or visit CDC's website at PicCapture.uy. CDC Tdap Vaccine VIS (01/01/12) Document Released: 02/10/2012 Document Revised: 12/26/2013 Document Reviewed: 11/23/2013 ExitCare Patient Information 2015 [place], [place]. This information is not intended to replace advice given to you by your health care provider. Make sure you discuss any questions you have with your health care provider.

## 2014-05-26 NOTE — Progress Notes (Signed)
   Subjective:    Patient ID: Vanessa Vega, female    DOB: 06/23/1961, 53 y.o.   MRN: [id number]  HPI 53 year old female who is here to followup leg wounds. At the time of her last visit both legs and wounds which appear to be dry and healing and no wraps were applied. She finished an antibiotic that she had been given in the emergency room. I did give her medication for pain for these legs. She has fairly severe stasis dermatitis and wounds that are the result.    Review of Systems  Constitutional: Negative.   Respiratory: Negative.   Cardiovascular: Positive for leg swelling.  Genitourinary: Negative.   Skin: Negative.        L leg with stasis dermatitis; area appears very rough and broken but not draining  Neurological: Negative.        Objective:   Physical Exam  Skin:  L leg wrapped with Unna boot and ace Pt has a dtr who does wound care. I would like her to check and change the dressing bi-weekly, then weekly in hopes of having wound heal     BP 149/101  Pulse 96  Temp(Src) 98.4 F (36.9 C) (Oral)  Ht 5\' 3"  (1.6 m)  Wt 330 lb (149.687 kg)  BMI 58.47 kg/m2     Assessment & Plan:  1. Open wound of knee, leg (except thigh), and ankle, complicated, left, initial encounter Leg wrapped.  Expense is an issue with this person.  i have asked her to pick up another wrap at drug store and care for her legs with assistance from her daughter  Frederica KusterStephen M Abbee Cremeens MD

## 2014-08-18 IMAGING — CR DG CHEST 1V PORT
1 series · 1 of 1 positions shown · non-contrast
Comparison: None.

CLINICAL DATA: Shortness of breath.

PORTABLE CHEST - 1 VIEW

[view not recorded]
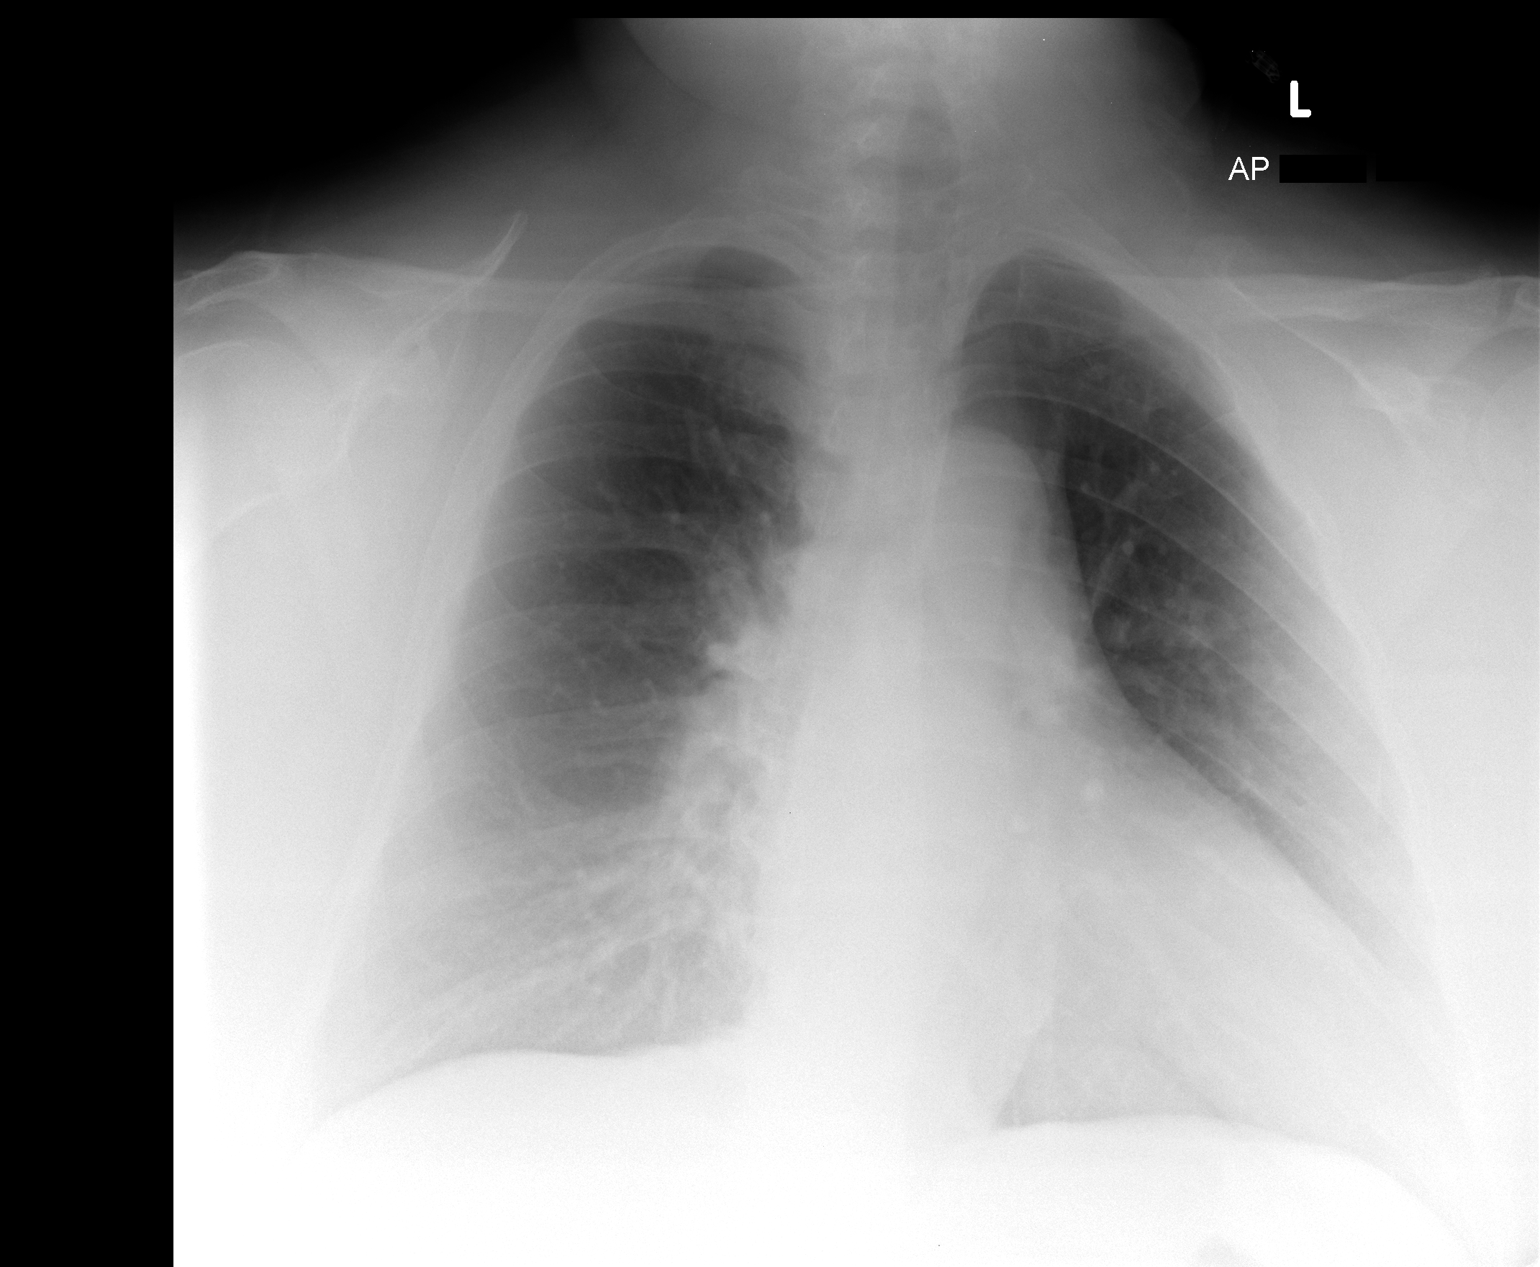

[1 of 1 positions shown; findings below may reference images not displayed]

FINDINGS: The heart is mildly enlarged.  No overt edema or
infiltrate is seen.  No pleural fluid identified.
IMPRESSION: Cardiomegaly without pulmonary edema.

## 2014-09-14 ENCOUNTER — Encounter (HOSPITAL_COMMUNITY): Payer: Self-pay | Admitting: *Deleted

## 2014-09-14 ENCOUNTER — Emergency Department (HOSPITAL_COMMUNITY)
Admission: EM | Admit: 2014-09-14 | Discharge: 2014-09-14 | Disposition: A | Discharge status: Home / Self Care | Payer: Self-pay | Attending: Emergency Medicine | Admitting: Emergency Medicine

## 2014-09-14 DIAGNOSIS — L03116 Cellulitis of left lower limb: Secondary | ICD-10-CM | POA: Insufficient documentation

## 2014-09-14 DIAGNOSIS — E119 Type 2 diabetes mellitus without complications: Secondary | ICD-10-CM | POA: Insufficient documentation

## 2014-09-14 DIAGNOSIS — R112 Nausea with vomiting, unspecified: Secondary | ICD-10-CM | POA: Insufficient documentation

## 2014-09-14 DIAGNOSIS — Z79899 Other long term (current) drug therapy: Secondary | ICD-10-CM | POA: Insufficient documentation

## 2014-09-14 DIAGNOSIS — Z8614 Personal history of Methicillin resistant Staphylococcus aureus infection: Secondary | ICD-10-CM | POA: Insufficient documentation

## 2014-09-14 DIAGNOSIS — R197 Diarrhea, unspecified: Secondary | ICD-10-CM | POA: Insufficient documentation

## 2014-09-14 DIAGNOSIS — Z7982 Long term (current) use of aspirin: Secondary | ICD-10-CM | POA: Insufficient documentation

## 2014-09-14 DIAGNOSIS — I10 Essential (primary) hypertension: Secondary | ICD-10-CM | POA: Insufficient documentation

## 2014-09-14 DIAGNOSIS — L03119 Cellulitis of unspecified part of limb: Secondary | ICD-10-CM

## 2014-09-14 DIAGNOSIS — R000 Tachycardia, unspecified: Secondary | ICD-10-CM | POA: Insufficient documentation

## 2014-09-14 DIAGNOSIS — Z91040 Latex allergy status: Secondary | ICD-10-CM | POA: Insufficient documentation

## 2014-09-14 DIAGNOSIS — Z880 Allergy status to penicillin: Secondary | ICD-10-CM | POA: Insufficient documentation

## 2014-09-14 DIAGNOSIS — R011 Cardiac murmur, unspecified: Secondary | ICD-10-CM | POA: Insufficient documentation

## 2014-09-14 LAB — CBC WITH DIFFERENTIAL/PLATELET
BASOS ABS: 0 10*3/uL (ref 0.0–0.1)
Basophils Relative: 0 % (ref 0–1)
Eosinophils Absolute: 0.1 10*3/uL (ref 0.0–0.7)
Eosinophils Relative: 1 % (ref 0–5)
HCT: 36 % (ref 36.0–46.0)
Hemoglobin: 11.8 g/dL — ABNORMAL LOW (ref 12.0–15.0)
LYMPHS PCT: 18 % (ref 12–46)
Lymphs Abs: 1.5 10*3/uL (ref 0.7–4.0)
MCH: 28.9 pg (ref 26.0–34.0)
MCHC: 32.8 g/dL (ref 30.0–36.0)
MCV: 88.2 fL (ref 78.0–100.0)
Monocytes Absolute: 0.6 10*3/uL (ref 0.1–1.0)
Monocytes Relative: 7 % (ref 3–12)
NEUTROS PCT: 74 % (ref 43–77)
Neutro Abs: 6.3 10*3/uL (ref 1.7–7.7)
Platelets: 285 10*3/uL (ref 150–400)
RBC: 4.08 MIL/uL (ref 3.87–5.11)
RDW: 13.9 % (ref 11.5–15.5)
WBC: 8.4 10*3/uL (ref 4.0–10.5)

## 2014-09-14 LAB — BASIC METABOLIC PANEL
ANION GAP: 9 (ref 5–15)
BUN: 15 mg/dL (ref 6–23)
CHLORIDE: 102 meq/L (ref 96–112)
CO2: 25 mmol/L (ref 19–32)
CREATININE: 0.78 mg/dL (ref 0.50–1.10)
Calcium: 9.1 mg/dL (ref 8.4–10.5)
GFR calc Af Amer: >90 mL/min (ref >90)
Glucose, Bld: 169 mg/dL — ABNORMAL HIGH (ref 70–99)
POTASSIUM: 3.6 mmol/L (ref 3.5–5.1)
SODIUM: 136 mmol/L (ref 135–145)

## 2014-09-14 LAB — PROTIME-INR
INR: 1.08 (ref 0.00–1.49)
Prothrombin Time: 14.1 seconds (ref 11.6–15.2)

## 2014-09-14 MED ORDER — OXYCODONE-ACETAMINOPHEN 5-325 MG PO TABS
ORAL_TABLET | ORAL | Status: AC
Start: 2014-09-14 — End: 2014-09-14
  Administered 2014-09-14: 1 via ORAL
  Filled 2014-09-14: qty 1

## 2014-09-14 MED ORDER — ONDANSETRON 8 MG PO TBDP
ORAL_TABLET | ORAL | Status: AC
  Filled 2014-09-14: qty 1

## 2014-09-14 MED ORDER — SULFAMETHOXAZOLE-TRIMETHOPRIM 800-160 MG PO TABS: 1.0000 | ORAL_TABLET | Freq: Two times a day (BID) | ORAL | Status: AC

## 2014-09-14 MED ORDER — OXYCODONE-ACETAMINOPHEN 5-325 MG PO TABS: 1.0000 | ORAL_TABLET | Freq: Four times a day (QID) | ORAL | Status: AC | PRN

## 2014-09-14 MED ORDER — OXYCODONE-ACETAMINOPHEN 5-325 MG PO TABS
1.0000 | ORAL_TABLET | Freq: Once | ORAL | Status: AC
  Administered 2014-09-14: 1 via ORAL

## 2014-09-14 MED ORDER — ONDANSETRON 8 MG PO TBDP
8.0000 mg | ORAL_TABLET | Freq: Once | ORAL | Status: AC
  Administered 2014-09-14: 8 mg via ORAL

## 2014-09-14 NOTE — ED Notes (Signed)
Pt with cellulitis to left lower leg ongoing for a year but worse over last several days, + N/V/D, states abscessed tooth few days ago as well, unknown of fever

## 2014-09-14 NOTE — ED Provider Notes (Signed)
CSN: [id number]     Arrival date & time 09/14/14  2105 History   First MD Initiated Contact with Patient 09/14/14 2121     Chief Complaint  Patient presents with  . Recurrent Skin Infections     (Consider location/radiation/quality/duration/timing/severity/associated sxs/prior Treatment) Patient is a 54 y.o. female presenting with leg pain. The history is provided by the patient.  Leg Pain  Rethel Sebek is a 54 y.o. morbidly obese female who presents to the ED with pain, swelling and redness to the left lower leg. She has had problems with cellulitis off and on for the past year but over the past 4 days the redness and pain has increased. She complains of n/v/d today. She had an abscessed tooth a few days ago as well. She does not report fever or chills.  Past Medical History  Diagnosis Date  . Cellulitis   . Hypertension   . Heart murmur   . MRSA (methicillin resistant staph aureus) culture positive   . Diabetes mellitus without complication   . Thyroid disease    Past Surgical History  Procedure Laterality Date  . Hernia repair    . Appendectomy    . Cholecystectomy     Family History  Problem Relation Age of Onset  . Heart disease Mother   . Diabetes Father   . Cancer Father     PROSTATE  . Cancer Sister     BREAST  . Stroke Brother   . Cancer Maternal Aunt     BREAST  . Cancer Maternal Aunt     BREAST  . Heart attack Brother    History  Substance Use Topics  . Smoking status: Never Smoker   . Smokeless tobacco: Not on file  . Alcohol Use: No   OB History    No data available     Review of Systems  Constitutional: Negative for chills.  HENT: Negative.   Eyes: Negative for visual disturbance.  Respiratory: Negative for shortness of breath.   Cardiovascular: Negative for chest pain.  Gastrointestinal: Positive for nausea, vomiting and diarrhea.  Musculoskeletal: Positive for joint swelling.       Pain and redness left lower leg  Skin: Positive for wound.   Neurological: Negative for syncope and headaches.  Psychiatric/Behavioral: Negative for confusion. The patient is not nervous/anxious.       Allergies  Other; Clindamycin/lincomycin; Latex; Levaquin; and Penicillins  Home Medications   Prior to Admission medications   Medication Sig Start Date End Date Taking? Authorizing Provider  aspirin EC 81 MG tablet Take 81 mg by mouth daily.    Yes Historical Provider, MD  metFORMIN (GLUCOPHAGE) 500 MG tablet Take 1 tablet (500 mg total) by mouth 2 (two) times daily with a meal. 04/26/14  Yes Frederica Kuster, MD  acetaminophen (TYLENOL) 500 MG tablet Take 500 mg by mouth every 6 (six) hours as needed for pain.    Historical Provider, MD  furosemide (LASIX) 40 MG tablet Take 1 tablet (40 mg total) by mouth as needed. Patient taking differently: Take 40 mg by mouth daily as needed for fluid or edema.  04/26/14   Frederica Kuster, MD  HYDROcodone-acetaminophen (NORCO) 5-325 MG per tablet Take 2 tablets by mouth every 4 (four) hours as needed. Patient not taking: Reported on 09/14/2014 05/26/14   Frederica Kuster, MD  oxyCODONE-acetaminophen (ROXICET) 5-325 MG per tablet Take 1 tablet by mouth every 6 (six) hours as needed for severe pain. 09/14/14   Hope M  Damian Leavell, NP  sulfamethoxazole-trimethoprim (BACTRIM DS,SEPTRA DS) 800-160 MG per tablet Take 1 tablet by mouth 2 (two) times daily. 09/14/14 09/21/14  Hope Orlene Och, NP   BP 149/84 mmHg  Pulse 91  Temp(Src) 98.1 F (36.7 C) (Oral)  Resp 20  Ht  (1.6 m)  Wt 320 lb (145.151 kg)  BMI 56.70 kg/m2  SpO2 99% Physical Exam  Constitutional: She is oriented to person, place, and time. No distress.  Morbidly obese  HENT:  Head: Normocephalic.  Eyes: EOM are normal.  Neck: Neck supple.  Cardiovascular: Tachycardia present.   Pulmonary/Chest: Effort normal.  Musculoskeletal:       Legs: Left lower leg with erythema, edema, tenderness. Pedal pulses 2+, adequate circulation.  Neurological: She is  alert and oriented to person, place, and time. No cranial nerve deficit.  Skin: Skin is warm and dry.  Psychiatric: She has a normal mood and affect. Her behavior is normal.  Nursing note and vitals reviewed.   ED Course  Procedures (including critical care time) Labs Review Results for orders placed or performed during the hospital encounter of 09/14/14 (from the past 24 hour(s))  Basic metabolic panel     Status: Abnormal   Collection Time: 09/14/14  9:56 PM  Result Value Ref Range   Sodium 136 135 - 145 mmol/L   Potassium 3.6 3.5 - 5.1 mmol/L   Chloride 102 96 - 112 mEq/L   CO2 25 19 - 32 mmol/L   Glucose, Bld 169 (H) 70 - 99 mg/dL   BUN 15 6 - 23 mg/dL   Creatinine, Ser 6.96 0.50 - 1.10 mg/dL   Calcium 9.1 8.4 - 29.5 mg/dL   GFR calc non Af Amer >90 >90 mL/min   GFR calc Af Amer >90 >90 mL/min   Anion gap 9 5 - 15  CBC with Differential     Status: Abnormal   Collection Time: 09/14/14  9:56 PM  Result Value Ref Range   WBC 8.4 4.0 - 10.5 K/uL   RBC 4.08 3.87 - 5.11 MIL/uL   Hemoglobin 11.8 (L) 12.0 - 15.0 g/dL   HCT 28.4 13.2 - 44.0 %   MCV 88.2 78.0 - 100.0 fL   MCH 28.9 26.0 - 34.0 pg   MCHC 32.8 30.0 - 36.0 g/dL   RDW 10.2 72.5 - 36.6 %   Platelets 285 150 - 400 K/uL   Neutrophils Relative % 74 43 - 77 %   Neutro Abs 6.3 1.7 - 7.7 K/uL   Lymphocytes Relative 18 12 - 46 %   Lymphs Abs 1.5 0.7 - 4.0 K/uL   Monocytes Relative 7 3 - 12 %   Monocytes Absolute 0.6 0.1 - 1.0 K/uL   Eosinophils Relative 1 0 - 5 %   Eosinophils Absolute 0.1 0.0 - 0.7 K/uL   Basophils Relative 0 0 - 1 %   Basophils Absolute 0.0 0.0 - 0.1 K/uL  Protime-INR     Status: None   Collection Time: 09/14/14 10:30 PM  Result Value Ref Range   Prothrombin Time 14.1 11.6 - 15.2 seconds   INR 1.08 0.00 - 1.49     MDM  54 y.o. female with pain and redness to the left lower leg. Dr. Juleen China in to examine the patient and discuss plan of care out patient vs inpatient treatment. Patient request out  patient treatment. Will treat with antibiotics and pain medication. She will return for worsening symptoms. Discussed with the patient and all questioned fully answered.  Stable for discharge without red streaking or signs of sepsis.  Final diagnoses:  Cellulitis of lower leg     Janne NapoleonHope M Neese, NP 09/15/14 [id number]  Raeford RazorStephen Kohut, MD 09/15/14 1600

## 2014-09-14 NOTE — ED Notes (Signed)
Discharge instructions given, pt demonstrated teach back and verbal understanding. No concerns voiced.  

## 2014-09-14 NOTE — ED Notes (Signed)
On initial assessment pt stated the wound was not painful, now states pain is at a 9 of 10 on scale. Percocet and zofran given.

## 2015-04-29 ENCOUNTER — Other Ambulatory Visit: Payer: Self-pay | Admitting: Family Medicine

## 2015-08-30 IMAGING — CT CT HEAD W/O CM
1 series · 16 of 30 positions shown, 20 images · non-contrast
Comparison: None

CLINICAL DATA: Diabetic, LEFT neck pain and pulling, visual
changes, numbness in extremities, headache, vertigo, history
hypertension

EXAM:
CT HEAD WITHOUT CONTRAST
TECHNIQUE: Contiguous axial images were obtained from the base of the skull
through the vertex without intravenous contrast.

[Series 2: headseq 4.8 h37s · axial · 0.49mm/px · z∈[+103,+243]mm · 16 of 30 slices shown, 20 images]
[im 2/30  brain]
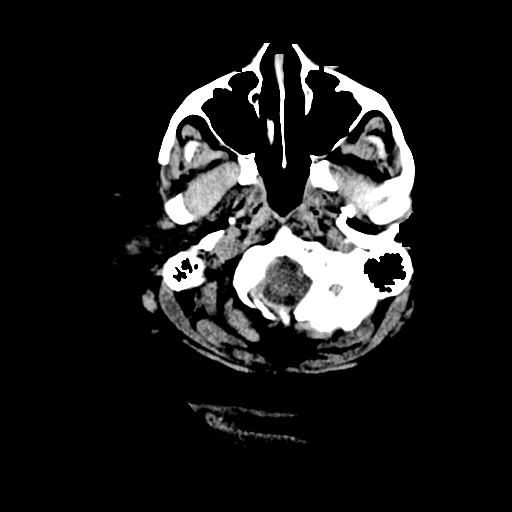
[im 2/30  bone]
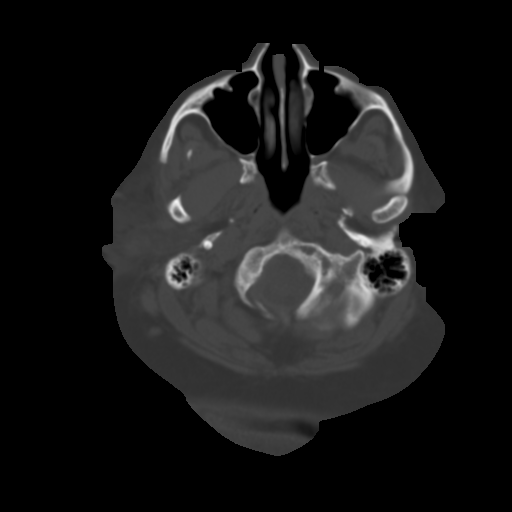
[im 4/30  brain]
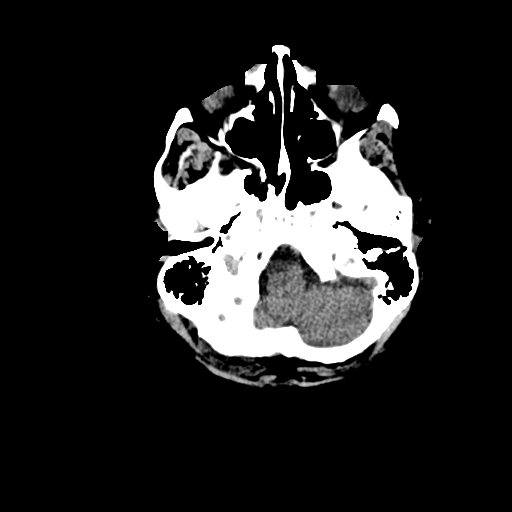
[im 6/30  brain]
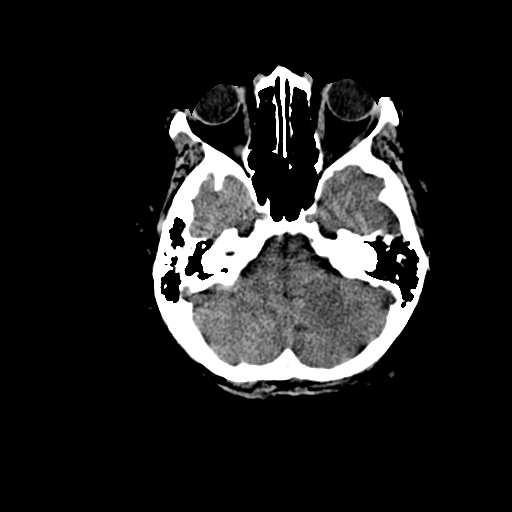
[im 8/30  brain]
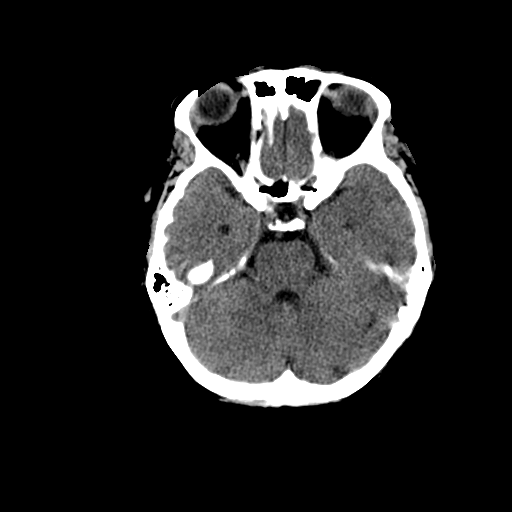
[im 9/30  brain]
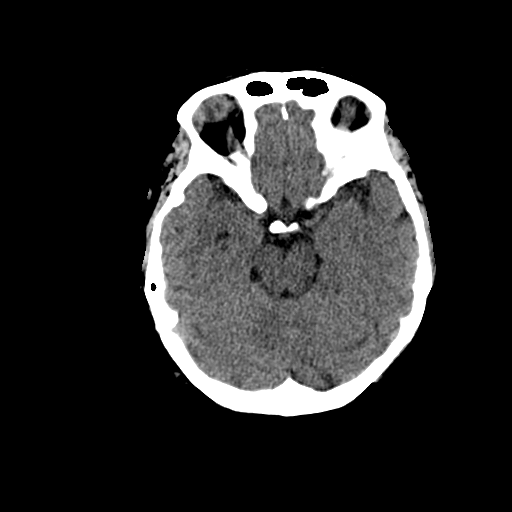
[im 9/30  bone]
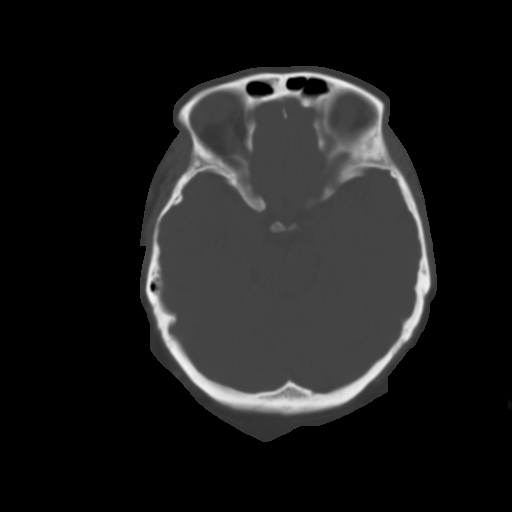
[im 11/30  brain]
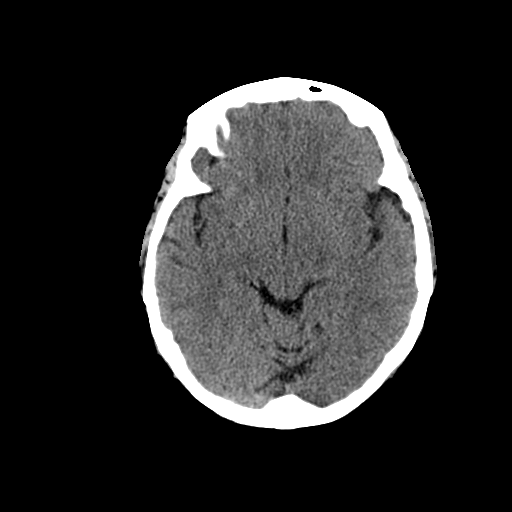
[im 13/30  brain]
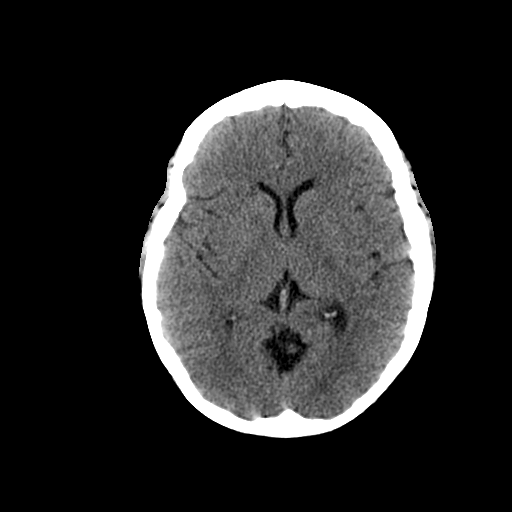
[im 15/30  brain]
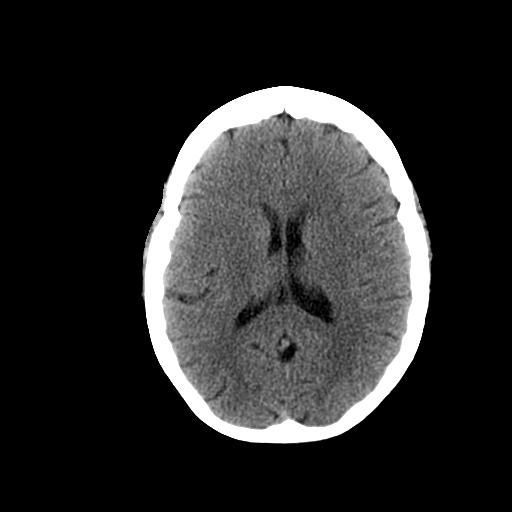
[im 16/30  brain]
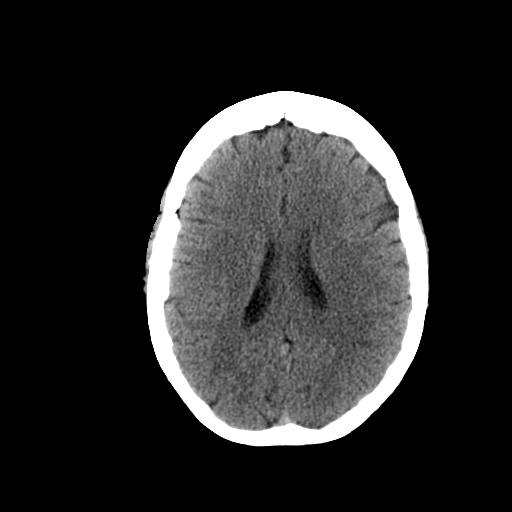
[im 16/30  bone]
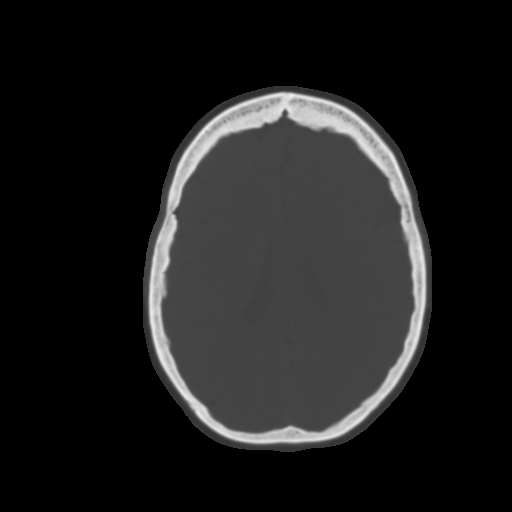
[im 18/30  brain]
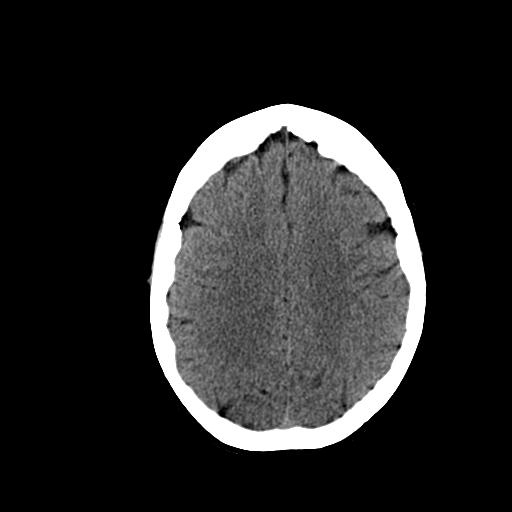
[im 20/30  brain]
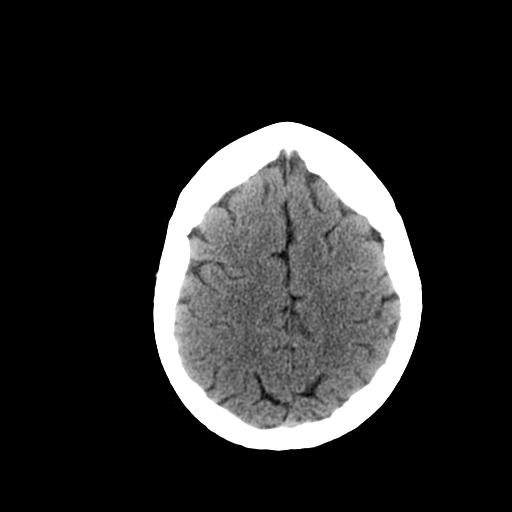
[im 22/30  brain]
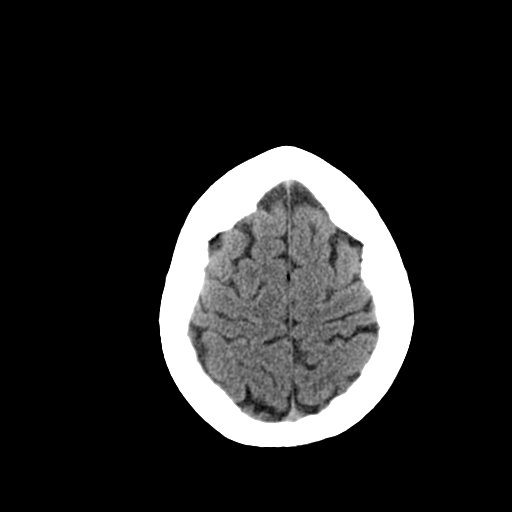
[im 23/30  brain]
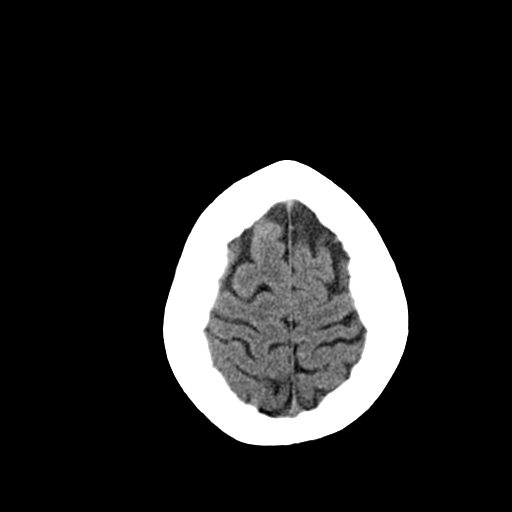
[im 23/30  bone]
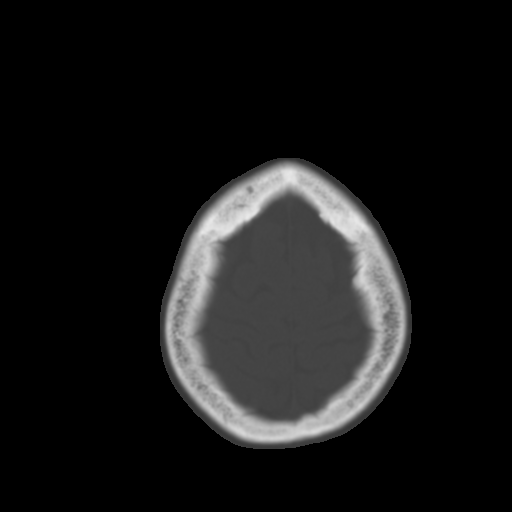
[im 25/30  brain]
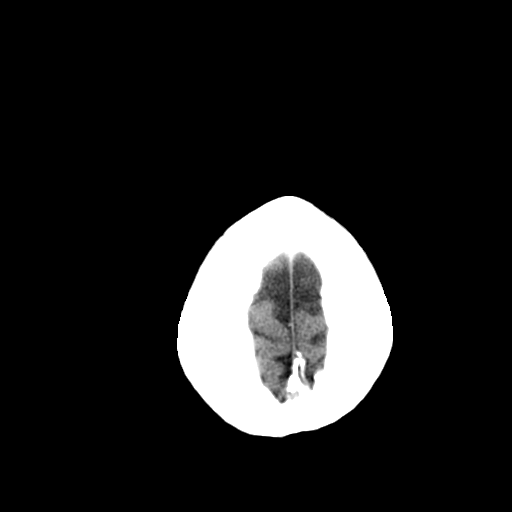
[im 27/30  brain]
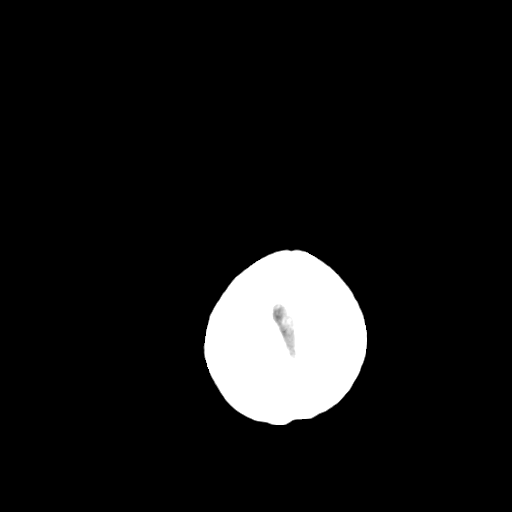
[im 29/30  brain]
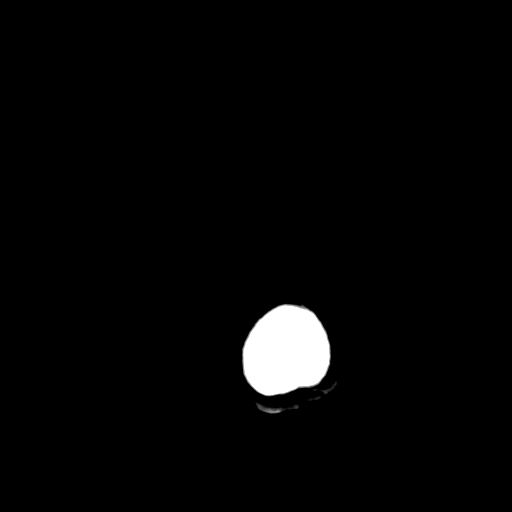

[16 of 30 positions shown; findings below may reference images not displayed]

FINDINGS: Normal ventricular morphology.

No midline shift or mass effect.

Question subtle hypoattenuation at LEFT occipital lobe, could
potentially be artifactual but cannot exclude infarct.

No intracranial hemorrhage or mass lesion.

No extra-axial fluid collections.

Orbital soft tissue planes clear.

Bones and sinuses unremarkable.
IMPRESSION: Subtle hypoattenuation at LEFT occipital lobe question, potentially
artifact but cannot exclude subtle acute infarct.

Remainder of exam unremarkable.
